# Patient Record
Sex: Female | Born: 1980 | Race: White | Hispanic: No | Smoking: Current every day smoker
Health system: Southern US, Community
[De-identification: ages and names within clinical notes are randomized; demographics above are authoritative.]

## PROBLEM LIST (undated history)

## (undated) DIAGNOSIS — M1990 Unspecified osteoarthritis, unspecified site: Secondary | ICD-10-CM

## (undated) DIAGNOSIS — F32A Depression, unspecified: Secondary | ICD-10-CM

## (undated) DIAGNOSIS — G8929 Other chronic pain: Secondary | ICD-10-CM

## (undated) DIAGNOSIS — J45909 Unspecified asthma, uncomplicated: Secondary | ICD-10-CM

## (undated) DIAGNOSIS — T1491XA Suicide attempt, initial encounter: Secondary | ICD-10-CM

## (undated) DIAGNOSIS — M542 Cervicalgia: Secondary | ICD-10-CM

## (undated) DIAGNOSIS — F410 Panic disorder [episodic paroxysmal anxiety] without agoraphobia: Secondary | ICD-10-CM

## (undated) DIAGNOSIS — D649 Anemia, unspecified: Secondary | ICD-10-CM

## (undated) DIAGNOSIS — K219 Gastro-esophageal reflux disease without esophagitis: Secondary | ICD-10-CM

## (undated) DIAGNOSIS — M25512 Pain in left shoulder: Secondary | ICD-10-CM

## (undated) DIAGNOSIS — R0789 Other chest pain: Secondary | ICD-10-CM

## (undated) DIAGNOSIS — R011 Cardiac murmur, unspecified: Secondary | ICD-10-CM

## (undated) DIAGNOSIS — F419 Anxiety disorder, unspecified: Secondary | ICD-10-CM

## (undated) DIAGNOSIS — F329 Major depressive disorder, single episode, unspecified: Secondary | ICD-10-CM

## (undated) DIAGNOSIS — Z8739 Personal history of other diseases of the musculoskeletal system and connective tissue: Secondary | ICD-10-CM

## (undated) HISTORY — PX: OTHER SURGICAL HISTORY: SHX169

## (undated) HISTORY — PX: APPENDECTOMY: SHX54

## (undated) HISTORY — PX: MYRINGOTOMY WITH TUBE PLACEMENT: SHX5663

---

## 2001-09-03 ENCOUNTER — Emergency Department (HOSPITAL_COMMUNITY): Admission: EM | Admit: 2001-09-03 | Discharge: 2001-09-04 | Payer: Self-pay | Admitting: Emergency Medicine

## 2002-02-28 ENCOUNTER — Encounter: Payer: Self-pay | Admitting: Internal Medicine

## 2002-02-28 ENCOUNTER — Emergency Department (HOSPITAL_COMMUNITY): Admission: EM | Admit: 2002-02-28 | Discharge: 2002-02-28 | Payer: Self-pay | Admitting: Emergency Medicine

## 2003-09-26 ENCOUNTER — Ambulatory Visit (HOSPITAL_COMMUNITY): Admission: RE | Admit: 2003-09-26 | Discharge: 2003-09-26 | Payer: Self-pay | Admitting: *Deleted

## 2004-08-18 ENCOUNTER — Emergency Department (HOSPITAL_COMMUNITY): Admission: EM | Admit: 2004-08-18 | Discharge: 2004-08-18 | Payer: Self-pay | Admitting: Emergency Medicine

## 2005-08-28 ENCOUNTER — Ambulatory Visit: Payer: Self-pay | Admitting: Internal Medicine

## 2005-09-04 ENCOUNTER — Ambulatory Visit: Payer: Self-pay | Admitting: Cardiology

## 2006-10-29 ENCOUNTER — Emergency Department (HOSPITAL_COMMUNITY): Admission: EM | Admit: 2006-10-29 | Discharge: 2006-10-30 | Payer: Self-pay | Admitting: Emergency Medicine

## 2008-09-06 ENCOUNTER — Emergency Department (HOSPITAL_COMMUNITY): Admission: EM | Admit: 2008-09-06 | Discharge: 2008-09-06 | Payer: Self-pay | Admitting: Emergency Medicine

## 2011-01-05 ENCOUNTER — Other Ambulatory Visit (HOSPITAL_COMMUNITY): Payer: Self-pay | Admitting: Sports Medicine

## 2011-01-05 DIAGNOSIS — M75102 Unspecified rotator cuff tear or rupture of left shoulder, not specified as traumatic: Secondary | ICD-10-CM

## 2011-01-12 ENCOUNTER — Other Ambulatory Visit (HOSPITAL_COMMUNITY): Payer: Self-pay

## 2011-01-15 ENCOUNTER — Other Ambulatory Visit: Payer: Self-pay | Admitting: Sports Medicine

## 2011-01-15 DIAGNOSIS — M75100 Unspecified rotator cuff tear or rupture of unspecified shoulder, not specified as traumatic: Secondary | ICD-10-CM

## 2011-01-18 ENCOUNTER — Ambulatory Visit
Admission: RE | Admit: 2011-01-18 | Discharge: 2011-01-18 | Disposition: A | Discharge status: Home / Self Care | Payer: Self-pay | Source: Ambulatory Visit | Attending: Sports Medicine | Admitting: Sports Medicine

## 2011-01-18 DIAGNOSIS — M75100 Unspecified rotator cuff tear or rupture of unspecified shoulder, not specified as traumatic: Secondary | ICD-10-CM

## 2011-01-22 ENCOUNTER — Other Ambulatory Visit: Payer: Self-pay | Admitting: Sports Medicine

## 2011-01-22 DIAGNOSIS — M542 Cervicalgia: Secondary | ICD-10-CM

## 2011-01-25 ENCOUNTER — Other Ambulatory Visit: Payer: Self-pay

## 2011-02-12 ENCOUNTER — Other Ambulatory Visit: Payer: Self-pay | Admitting: Orthopedic Surgery

## 2011-02-12 DIAGNOSIS — M542 Cervicalgia: Secondary | ICD-10-CM

## 2011-02-17 ENCOUNTER — Ambulatory Visit
Admission: RE | Admit: 2011-02-17 | Discharge: 2011-02-17 | Disposition: A | Discharge status: Home / Self Care | Payer: Medicaid Other | Source: Ambulatory Visit | Attending: Orthopedic Surgery | Admitting: Orthopedic Surgery

## 2011-02-17 DIAGNOSIS — M542 Cervicalgia: Secondary | ICD-10-CM

## 2012-07-22 ENCOUNTER — Emergency Department (HOSPITAL_COMMUNITY): Payer: Self-pay

## 2012-07-22 ENCOUNTER — Encounter (HOSPITAL_COMMUNITY): Payer: Self-pay | Admitting: *Deleted

## 2012-07-22 ENCOUNTER — Emergency Department (HOSPITAL_COMMUNITY)
Admission: EM | Admit: 2012-07-22 | Discharge: 2012-07-22 | Disposition: A | Discharge status: Home / Self Care | Payer: Self-pay | Attending: Emergency Medicine | Admitting: Emergency Medicine

## 2012-07-22 ENCOUNTER — Other Ambulatory Visit: Payer: Self-pay

## 2012-07-22 DIAGNOSIS — I2510 Atherosclerotic heart disease of native coronary artery without angina pectoris: Secondary | ICD-10-CM | POA: Insufficient documentation

## 2012-07-22 DIAGNOSIS — R0789 Other chest pain: Secondary | ICD-10-CM | POA: Insufficient documentation

## 2012-07-22 DIAGNOSIS — F411 Generalized anxiety disorder: Secondary | ICD-10-CM | POA: Insufficient documentation

## 2012-07-22 DIAGNOSIS — F419 Anxiety disorder, unspecified: Secondary | ICD-10-CM

## 2012-07-22 DIAGNOSIS — Z9089 Acquired absence of other organs: Secondary | ICD-10-CM | POA: Insufficient documentation

## 2012-07-22 LAB — COMPREHENSIVE METABOLIC PANEL
ALT: 12 U/L (ref 0–35)
AST: 15 U/L (ref 0–37)
Albumin: 3.6 g/dL (ref 3.5–5.2)
Alkaline Phosphatase: 80 U/L (ref 39–117)
BUN: 6 mg/dL (ref 6–23)
CO2: 23 mEq/L (ref 19–32)
Calcium: 8.9 mg/dL (ref 8.4–10.5)
Chloride: 103 mEq/L (ref 96–112)
Creatinine, Ser: 0.58 mg/dL (ref 0.50–1.10)
GFR calc Af Amer: >90 mL/min (ref >90)
GFR calc non Af Amer: >90 mL/min (ref >90)
Glucose, Bld: 109 mg/dL — ABNORMAL HIGH (ref 70–99)
Potassium: 3 mEq/L — ABNORMAL LOW (ref 3.5–5.1)
Sodium: 136 mEq/L (ref 135–145)
Total Bilirubin: 0.4 mg/dL (ref 0.3–1.2)
Total Protein: 6.5 g/dL (ref 6.0–8.3)

## 2012-07-22 LAB — CBC WITH DIFFERENTIAL/PLATELET
Basophils Absolute: 0 10*3/uL (ref 0.0–0.1)
Basophils Relative: 0 % (ref 0–1)
Eosinophils Absolute: 0.1 10*3/uL (ref 0.0–0.7)
Eosinophils Relative: 1 % (ref 0–5)
HCT: 40.3 % (ref 36.0–46.0)
Hemoglobin: 14.6 g/dL (ref 12.0–15.0)
Lymphocytes Relative: 44 % (ref 12–46)
Lymphs Abs: 2.6 10*3/uL (ref 0.7–4.0)
MCH: 33.6 pg (ref 26.0–34.0)
MCHC: 36.2 g/dL — ABNORMAL HIGH (ref 30.0–36.0)
MCV: 92.6 fL (ref 78.0–100.0)
Monocytes Absolute: 0.4 10*3/uL (ref 0.1–1.0)
Monocytes Relative: 7 % (ref 3–12)
Neutro Abs: 2.8 10*3/uL (ref 1.7–7.7)
Neutrophils Relative %: 48 % (ref 43–77)
Platelets: 205 10*3/uL (ref 150–400)
RBC: 4.35 MIL/uL (ref 3.87–5.11)
RDW: 12.5 % (ref 11.5–15.5)
WBC: 5.8 10*3/uL (ref 4.0–10.5)

## 2012-07-22 LAB — TROPONIN I: Troponin I: <0.3 ng/mL (ref <0.30)

## 2012-07-22 MED ORDER — DIAZEPAM 5 MG PO TABS: 5.0000 mg | ORAL_TABLET | Freq: Four times a day (QID) | ORAL | Status: DC | PRN

## 2012-07-22 NOTE — ED Provider Notes (Signed)
History     CSN: [id number]  Arrival date & time 07/22/12  7829   First MD Initiated Contact with Patient 07/22/12 1833      Chief Complaint  Patient presents with  . Chest Pain    (Consider location/radiation/quality/duration/timing/severity/associated sxs/prior treatment) HPI Comments: Patient with complaints of chest tightness since last night.  She tells me that she had a lot of stress in her life due to the passing of a close friend.  She has not seen him in some time and regrets not seeing him more.  She denies any history of heart problems.  There is no shortness of breath, diaphoresis, or radiation into the arm or jaw.  She denies recent exertional symptoms.  She does smoke 1 PPD and has done so for many years.  Patient is a 31 y.o. female presenting with chest pain. The history is provided by the patient.  Chest Pain Episode onset: last night. Duration of episode(s) is 24 hours. Chest pain occurs constantly. The chest pain is unchanged. The pain is associated with stress. The severity of the pain is moderate. The quality of the pain is described as tightness. The pain does not radiate. Chest pain is worsened by stress. Pertinent negatives for primary symptoms include no fever, no cough, no palpitations, no nausea and no dizziness. She tried nothing for the symptoms. Risk factors include smoking/tobacco exposure.     Past Medical History  Diagnosis Date  . Coronary artery disease     Past Surgical History  Procedure Date  . Appendectomy     History reviewed. No pertinent family history.  History  Substance Use Topics  . Smoking status: Current Some Day Smoker  . Smokeless tobacco: Not on file  . Alcohol Use: No    OB History    Grav Para Term Preterm Abortions TAB SAB Ect Mult Living                  Review of Systems  Constitutional: Negative for fever.  Respiratory: Negative for cough.   Cardiovascular: Positive for chest pain. Negative for palpitations.    Gastrointestinal: Negative for nausea.  Neurological: Negative for dizziness.  All other systems reviewed and are negative.    Allergies  Review of patient's allergies indicates no known allergies.  Home Medications  No current outpatient prescriptions on file.  BP 107/60  Pulse 87  Temp 98.8 F (37.1 C) (Oral)  Resp 20  SpO2 97%  LMP 07/18/2012  Physical Exam  Nursing note and vitals reviewed. Constitutional: She is oriented to person, place, and time. She appears well-developed and well-nourished. No distress.  HENT:  Head: Normocephalic and atraumatic.  Neck: Normal range of motion. Neck supple.  Cardiovascular: Normal rate and regular rhythm.  Exam reveals no gallop and no friction rub.   No murmur heard. Pulmonary/Chest: Effort normal and breath sounds normal. No respiratory distress. She has no wheezes.  Abdominal: Soft. Bowel sounds are normal. She exhibits no distension. There is no tenderness.  Musculoskeletal: Normal range of motion.  Neurological: She is alert and oriented to person, place, and time.  Skin: Skin is warm and dry. She is not diaphoretic.    ED Course  Procedures (including critical care time)   Labs Reviewed  CBC WITH DIFFERENTIAL  COMPREHENSIVE METABOLIC PANEL  TROPONIN I   No results found.   No diagnosis found.   Date: 07/22/2012  Rate: 84  Rhythm: normal sinus rhythm  QRS Axis: normal  Intervals: normal  ST/T Wave abnormalities: normal  Conduction Disutrbances:none  Narrative Interpretation:   Old EKG Reviewed: unchanged     MDM  The patient presents here with atypical chest pain that seems to be related to stress/anxiety due to the recent loss of a loved one.  The ekg and trop are both reassuring and I seriously doubt there is a cardiac etiology to this.  She seems fine and I believe she is very stable for discharge.  I will prescribe a small number of valium for her as these have helped her in the past.  She understands  to return if she experiences additional symptoms or if her condition worsens.          Geoffery Lyons, MD 07/22/12 2006

## 2012-07-22 NOTE — ED Notes (Signed)
Discharge instructions reviewed with pt, questions answered. Pt verbalized understanding.  

## 2012-07-22 NOTE — ED Notes (Signed)
Chest pain since yesterday, recent death in family with family problems

## 2012-07-24 ENCOUNTER — Emergency Department (HOSPITAL_COMMUNITY)
Admission: EM | Admit: 2012-07-24 | Discharge: 2012-07-24 | Disposition: A | Discharge status: Home / Self Care | Payer: Self-pay | Attending: Emergency Medicine | Admitting: Emergency Medicine

## 2012-07-24 ENCOUNTER — Encounter (HOSPITAL_COMMUNITY): Payer: Self-pay

## 2012-07-24 DIAGNOSIS — F17200 Nicotine dependence, unspecified, uncomplicated: Secondary | ICD-10-CM | POA: Insufficient documentation

## 2012-07-24 DIAGNOSIS — F329 Major depressive disorder, single episode, unspecified: Secondary | ICD-10-CM | POA: Insufficient documentation

## 2012-07-24 DIAGNOSIS — F3289 Other specified depressive episodes: Secondary | ICD-10-CM | POA: Insufficient documentation

## 2012-07-24 DIAGNOSIS — I2510 Atherosclerotic heart disease of native coronary artery without angina pectoris: Secondary | ICD-10-CM | POA: Insufficient documentation

## 2012-07-24 DIAGNOSIS — F411 Generalized anxiety disorder: Secondary | ICD-10-CM | POA: Insufficient documentation

## 2012-07-24 HISTORY — DX: Anxiety disorder, unspecified: F41.9

## 2012-07-24 HISTORY — DX: Depression, unspecified: F32.A

## 2012-07-24 HISTORY — DX: Panic disorder (episodic paroxysmal anxiety): F41.0

## 2012-07-24 HISTORY — DX: Suicide attempt, initial encounter: T14.91XA

## 2012-07-24 HISTORY — DX: Major depressive disorder, single episode, unspecified: F32.9

## 2012-07-24 LAB — CBC WITH DIFFERENTIAL/PLATELET
Basophils Absolute: 0 10*3/uL (ref 0.0–0.1)
Basophils Relative: 0 % (ref 0–1)
Eosinophils Absolute: 0 10*3/uL (ref 0.0–0.7)
Eosinophils Relative: 0 % (ref 0–5)
HCT: 44 % (ref 36.0–46.0)
Hemoglobin: 15.9 g/dL — ABNORMAL HIGH (ref 12.0–15.0)
Lymphocytes Relative: 22 % (ref 12–46)
Lymphs Abs: 2 10*3/uL (ref 0.7–4.0)
MCH: 33.4 pg (ref 26.0–34.0)
MCHC: 36.1 g/dL — ABNORMAL HIGH (ref 30.0–36.0)
MCV: 92.4 fL (ref 78.0–100.0)
Monocytes Absolute: 0.6 10*3/uL (ref 0.1–1.0)
Monocytes Relative: 6 % (ref 3–12)
Neutro Abs: 6.6 10*3/uL (ref 1.7–7.7)
Neutrophils Relative %: 72 % (ref 43–77)
Platelets: 224 10*3/uL (ref 150–400)
RBC: 4.76 MIL/uL (ref 3.87–5.11)
RDW: 12.4 % (ref 11.5–15.5)
WBC: 9.2 10*3/uL (ref 4.0–10.5)

## 2012-07-24 LAB — BASIC METABOLIC PANEL
BUN: 6 mg/dL (ref 6–23)
CO2: 25 mEq/L (ref 19–32)
Calcium: 9.3 mg/dL (ref 8.4–10.5)
Chloride: 101 mEq/L (ref 96–112)
Creatinine, Ser: 0.69 mg/dL (ref 0.50–1.10)
GFR calc Af Amer: >90 mL/min (ref >90)
GFR calc non Af Amer: >90 mL/min (ref >90)
Glucose, Bld: 85 mg/dL (ref 70–99)
Potassium: 3.6 mEq/L (ref 3.5–5.1)
Sodium: 136 mEq/L (ref 135–145)

## 2012-07-24 LAB — RAPID URINE DRUG SCREEN, HOSP PERFORMED
Amphetamines: NOT DETECTED
Barbiturates: NOT DETECTED
Benzodiazepines: POSITIVE — AB
Cocaine: NOT DETECTED
Opiates: NOT DETECTED
Tetrahydrocannabinol: POSITIVE — AB

## 2012-07-24 LAB — ETHANOL: Alcohol, Ethyl (B): <11 mg/dL (ref 0–11)

## 2012-07-24 MED ORDER — ALPRAZOLAM 0.5 MG PO TABS: 0.5000 mg | ORAL_TABLET | Freq: Three times a day (TID) | ORAL | Status: DC | PRN

## 2012-07-24 NOTE — ED Notes (Signed)
Pt wanded by security while in triage
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
## 2012-07-24 NOTE — ED Notes (Signed)
Pt reports depression, was told by police to come to er for eval, stated she is not si/hi.
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
## 2012-07-24 NOTE — ED Notes (Signed)
ACT team at bedside.  

## 2012-08-05 ENCOUNTER — Encounter (HOSPITAL_COMMUNITY): Payer: Self-pay | Admitting: *Deleted

## 2012-08-05 ENCOUNTER — Emergency Department (HOSPITAL_COMMUNITY)
Admission: EM | Admit: 2012-08-05 | Discharge: 2012-08-05 | Disposition: A | Discharge status: Home / Self Care | Payer: Self-pay | Attending: Emergency Medicine | Admitting: Emergency Medicine

## 2012-08-05 ENCOUNTER — Emergency Department (HOSPITAL_COMMUNITY): Payer: Self-pay

## 2012-08-05 DIAGNOSIS — F411 Generalized anxiety disorder: Secondary | ICD-10-CM | POA: Insufficient documentation

## 2012-08-05 DIAGNOSIS — R0609 Other forms of dyspnea: Secondary | ICD-10-CM | POA: Insufficient documentation

## 2012-08-05 DIAGNOSIS — F3289 Other specified depressive episodes: Secondary | ICD-10-CM | POA: Insufficient documentation

## 2012-08-05 DIAGNOSIS — R059 Cough, unspecified: Secondary | ICD-10-CM | POA: Insufficient documentation

## 2012-08-05 DIAGNOSIS — R0602 Shortness of breath: Secondary | ICD-10-CM | POA: Insufficient documentation

## 2012-08-05 DIAGNOSIS — R0989 Other specified symptoms and signs involving the circulatory and respiratory systems: Secondary | ICD-10-CM | POA: Insufficient documentation

## 2012-08-05 DIAGNOSIS — R05 Cough: Secondary | ICD-10-CM

## 2012-08-05 DIAGNOSIS — F329 Major depressive disorder, single episode, unspecified: Secondary | ICD-10-CM

## 2012-08-05 HISTORY — DX: Cardiac murmur, unspecified: R01.1

## 2012-08-05 MED ORDER — DIAZEPAM 5 MG PO TABS: ORAL_TABLET | ORAL | Status: DC

## 2012-08-05 MED ORDER — PREDNISONE 20 MG PO TABS
60.0000 mg | ORAL_TABLET | Freq: Once | ORAL | Status: AC
  Administered 2012-08-05: 60 mg via ORAL
  Filled 2012-08-05: qty 3

## 2012-08-05 MED ORDER — ALBUTEROL SULFATE HFA 108 (90 BASE) MCG/ACT IN AERS
2.0000 | INHALATION_SPRAY | RESPIRATORY_TRACT | Status: DC
  Administered 2012-08-05: 2 via RESPIRATORY_TRACT
  Filled 2012-08-05: qty 6.7

## 2012-08-05 MED ORDER — PREDNISONE 10 MG PO TABS: ORAL_TABLET | ORAL | Status: DC

## 2012-08-05 NOTE — ED Notes (Signed)
Patient transported to X-ray 

## 2012-08-05 NOTE — ED Notes (Addendum)
Cough , white sputum, feels sob.  Back pain

## 2012-08-05 NOTE — ED Notes (Signed)
H. Bryant, PA at bedside. 

## 2012-08-05 NOTE — ED Notes (Signed)
Pt with cough, denies fever, lungs clear after coughing, also with lower back pain, denies radiation of pain to legs
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
## 2012-08-21 NOTE — ED Provider Notes (Signed)
Medical screening examination/treatment/procedure(s) were performed by non-physician practitioner and as supervising physician I was immediately available for consultation/collaboration.  Flint Melter, MD 08/21/12 [phone]

## 2012-09-22 ENCOUNTER — Encounter (HOSPITAL_COMMUNITY): Payer: Self-pay | Admitting: *Deleted

## 2012-09-22 ENCOUNTER — Emergency Department (HOSPITAL_COMMUNITY)
Admission: EM | Admit: 2012-09-22 | Discharge: 2012-09-22 | Disposition: A | Discharge status: Home / Self Care | Payer: Self-pay | Attending: Emergency Medicine | Admitting: Emergency Medicine

## 2012-09-22 DIAGNOSIS — F411 Generalized anxiety disorder: Secondary | ICD-10-CM | POA: Insufficient documentation

## 2012-09-22 DIAGNOSIS — R071 Chest pain on breathing: Secondary | ICD-10-CM | POA: Insufficient documentation

## 2012-09-22 DIAGNOSIS — R0602 Shortness of breath: Secondary | ICD-10-CM | POA: Insufficient documentation

## 2012-09-22 DIAGNOSIS — Z8679 Personal history of other diseases of the circulatory system: Secondary | ICD-10-CM | POA: Insufficient documentation

## 2012-09-22 DIAGNOSIS — R0789 Other chest pain: Secondary | ICD-10-CM

## 2012-09-22 DIAGNOSIS — Z79899 Other long term (current) drug therapy: Secondary | ICD-10-CM | POA: Insufficient documentation

## 2012-09-22 DIAGNOSIS — I2510 Atherosclerotic heart disease of native coronary artery without angina pectoris: Secondary | ICD-10-CM | POA: Insufficient documentation

## 2012-09-22 DIAGNOSIS — F329 Major depressive disorder, single episode, unspecified: Secondary | ICD-10-CM | POA: Insufficient documentation

## 2012-09-22 DIAGNOSIS — F17200 Nicotine dependence, unspecified, uncomplicated: Secondary | ICD-10-CM | POA: Insufficient documentation

## 2012-09-22 DIAGNOSIS — F3289 Other specified depressive episodes: Secondary | ICD-10-CM | POA: Insufficient documentation

## 2012-09-22 LAB — TROPONIN I: Troponin I: <0.3 ng/mL (ref <0.30)

## 2012-09-22 MED ORDER — CYCLOBENZAPRINE HCL 5 MG PO TABS: 5.0000 mg | ORAL_TABLET | Freq: Three times a day (TID) | ORAL | Status: DC | PRN

## 2012-09-22 MED ORDER — IBUPROFEN 600 MG PO TABS: 600.0000 mg | ORAL_TABLET | Freq: Three times a day (TID) | ORAL | Status: DC | PRN

## 2012-09-22 MED ORDER — IBUPROFEN 800 MG PO TABS
800.0000 mg | ORAL_TABLET | Freq: Once | ORAL | Status: AC
  Administered 2012-09-22: 800 mg via ORAL
  Filled 2012-09-22: qty 1

## 2012-09-22 MED ORDER — CYCLOBENZAPRINE HCL 10 MG PO TABS
10.0000 mg | ORAL_TABLET | Freq: Once | ORAL | Status: AC
  Administered 2012-09-22: 10 mg via ORAL
  Filled 2012-09-22: qty 1

## 2012-09-22 NOTE — ED Notes (Signed)
Patient states that she did take 3 nitroglycerin of her mothers at home without relief.  States that she has taken aspirin as well.

## 2012-09-22 NOTE — ED Notes (Signed)
Reports onset of chest pain today at 1500; states has been anxious and upset re: having to go to court today to deal with "some issues" with her daughter.  States hx of panic attacks.  Took ASA 325mg  po and NTG 0.4mg  SL x 3 with no relief.

## 2012-09-22 NOTE — ED Notes (Addendum)
Patient counseled regarding the use of medications that are not prescribed to her relating to her mother's nitroglycerin, mother was also at bedside for this discussion, the dangers of this type of medication were thoroughly discussed with patient and family at bedside.  The patient states that she will not take any other medications that are not prescribed to her.  Mother also states that she will not give nitroglycerin to anyone else again.
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
## 2012-11-14 ENCOUNTER — Emergency Department (HOSPITAL_COMMUNITY)
Admission: EM | Admit: 2012-11-14 | Discharge: 2012-11-14 | Disposition: A | Discharge status: Home / Self Care | Payer: Self-pay | Attending: Emergency Medicine | Admitting: Emergency Medicine

## 2012-11-14 ENCOUNTER — Other Ambulatory Visit: Payer: Self-pay

## 2012-11-14 ENCOUNTER — Emergency Department (HOSPITAL_COMMUNITY): Payer: Self-pay

## 2012-11-14 ENCOUNTER — Encounter (HOSPITAL_COMMUNITY): Payer: Self-pay

## 2012-11-14 DIAGNOSIS — F329 Major depressive disorder, single episode, unspecified: Secondary | ICD-10-CM | POA: Insufficient documentation

## 2012-11-14 DIAGNOSIS — Z3202 Encounter for pregnancy test, result negative: Secondary | ICD-10-CM | POA: Insufficient documentation

## 2012-11-14 DIAGNOSIS — F17200 Nicotine dependence, unspecified, uncomplicated: Secondary | ICD-10-CM | POA: Insufficient documentation

## 2012-11-14 DIAGNOSIS — R011 Cardiac murmur, unspecified: Secondary | ICD-10-CM | POA: Insufficient documentation

## 2012-11-14 DIAGNOSIS — Z79899 Other long term (current) drug therapy: Secondary | ICD-10-CM | POA: Insufficient documentation

## 2012-11-14 DIAGNOSIS — R109 Unspecified abdominal pain: Secondary | ICD-10-CM | POA: Insufficient documentation

## 2012-11-14 DIAGNOSIS — F3289 Other specified depressive episodes: Secondary | ICD-10-CM | POA: Insufficient documentation

## 2012-11-14 DIAGNOSIS — I2510 Atherosclerotic heart disease of native coronary artery without angina pectoris: Secondary | ICD-10-CM | POA: Insufficient documentation

## 2012-11-14 DIAGNOSIS — N390 Urinary tract infection, site not specified: Secondary | ICD-10-CM | POA: Insufficient documentation

## 2012-11-14 DIAGNOSIS — M549 Dorsalgia, unspecified: Secondary | ICD-10-CM | POA: Insufficient documentation

## 2012-11-14 DIAGNOSIS — Z8659 Personal history of other mental and behavioral disorders: Secondary | ICD-10-CM | POA: Insufficient documentation

## 2012-11-14 LAB — CBC WITH DIFFERENTIAL/PLATELET
Eosinophils Relative: 2 % (ref 0–5)
HCT: 35.9 % — ABNORMAL LOW (ref 36.0–46.0)
Hemoglobin: 12.8 g/dL (ref 12.0–15.0)
Lymphocytes Relative: 51 % — ABNORMAL HIGH (ref 12–46)
MCV: 93.2 fL (ref 78.0–100.0)
Monocytes Absolute: 0.4 10*3/uL (ref 0.1–1.0)
Monocytes Relative: 8 % (ref 3–12)
Neutro Abs: 2.2 10*3/uL (ref 1.7–7.7)
WBC: 5.5 10*3/uL (ref 4.0–10.5)

## 2012-11-14 LAB — URINALYSIS, ROUTINE W REFLEX MICROSCOPIC
Hgb urine dipstick: NEGATIVE
Nitrite: NEGATIVE
Protein, ur: NEGATIVE mg/dL
Urobilinogen, UA: 0.2 mg/dL (ref 0.0–1.0)

## 2012-11-14 LAB — URINE MICROSCOPIC-ADD ON

## 2012-11-14 LAB — COMPREHENSIVE METABOLIC PANEL
BUN: 10 mg/dL (ref 6–23)
CO2: 27 mEq/L (ref 19–32)
Chloride: 102 mEq/L (ref 96–112)
Creatinine, Ser: 0.77 mg/dL (ref 0.50–1.10)
GFR calc Af Amer: >90 mL/min (ref >90)
GFR calc non Af Amer: >90 mL/min (ref >90)
Glucose, Bld: 93 mg/dL (ref 70–99)
Total Bilirubin: 0.2 mg/dL — ABNORMAL LOW (ref 0.3–1.2)

## 2012-11-14 LAB — D-DIMER, QUANTITATIVE: D-Dimer, Quant: <0.27 ug/mL-FEU (ref 0.00–0.48)

## 2012-11-14 MED ORDER — HYDROCODONE-ACETAMINOPHEN 5-325 MG PO TABS
1.0000 | ORAL_TABLET | Freq: Once | ORAL | Status: AC
  Administered 2012-11-14: 1 via ORAL
  Filled 2012-11-14: qty 1

## 2012-11-14 MED ORDER — HYDROCODONE-ACETAMINOPHEN 5-325 MG PO TABS: ORAL_TABLET | ORAL | Status: DC

## 2012-11-14 MED ORDER — CEPHALEXIN 500 MG PO CAPS
500.0000 mg | ORAL_CAPSULE | Freq: Once | ORAL | Status: AC
  Administered 2012-11-14: 500 mg via ORAL
  Filled 2012-11-14: qty 1

## 2012-11-14 MED ORDER — CEPHALEXIN 500 MG PO CAPS: 500.0000 mg | ORAL_CAPSULE | Freq: Four times a day (QID) | ORAL | Status: DC

## 2012-11-14 NOTE — ED Notes (Signed)
Pt with right sided rib pain, denies blood in urine or burning on urination, admits to picking up 20 quart tea at work recently, denies cough
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
## 2012-11-14 NOTE — ED Notes (Signed)
States pain started under posterior right ribs, worse with movement. Now pain increased and hurts to take deep breath. No injury noted.no cough or uri symptoms

## 2012-11-14 NOTE — ED Notes (Signed)
Patient transported to X-ray 

## 2012-11-16 LAB — URINE CULTURE: Colony Count: 4000

## 2012-11-18 NOTE — ED Provider Notes (Signed)
Medical screening examination/treatment/procedure(s) were performed by non-physician practitioner and as supervising physician I was immediately available for consultation/collaboration.   Enolia Koepke L Faelyn Sigler, MD 11/18/12 1534 

## 2013-08-20 IMAGING — CR DG CHEST 1V PORT
1 series · 1 of 1 positions shown · non-contrast
Comparison: Report 07/18/2007 no images available

CLINICAL DATA: Chest pain

PORTABLE CHEST - 1 VIEW

[view not recorded]
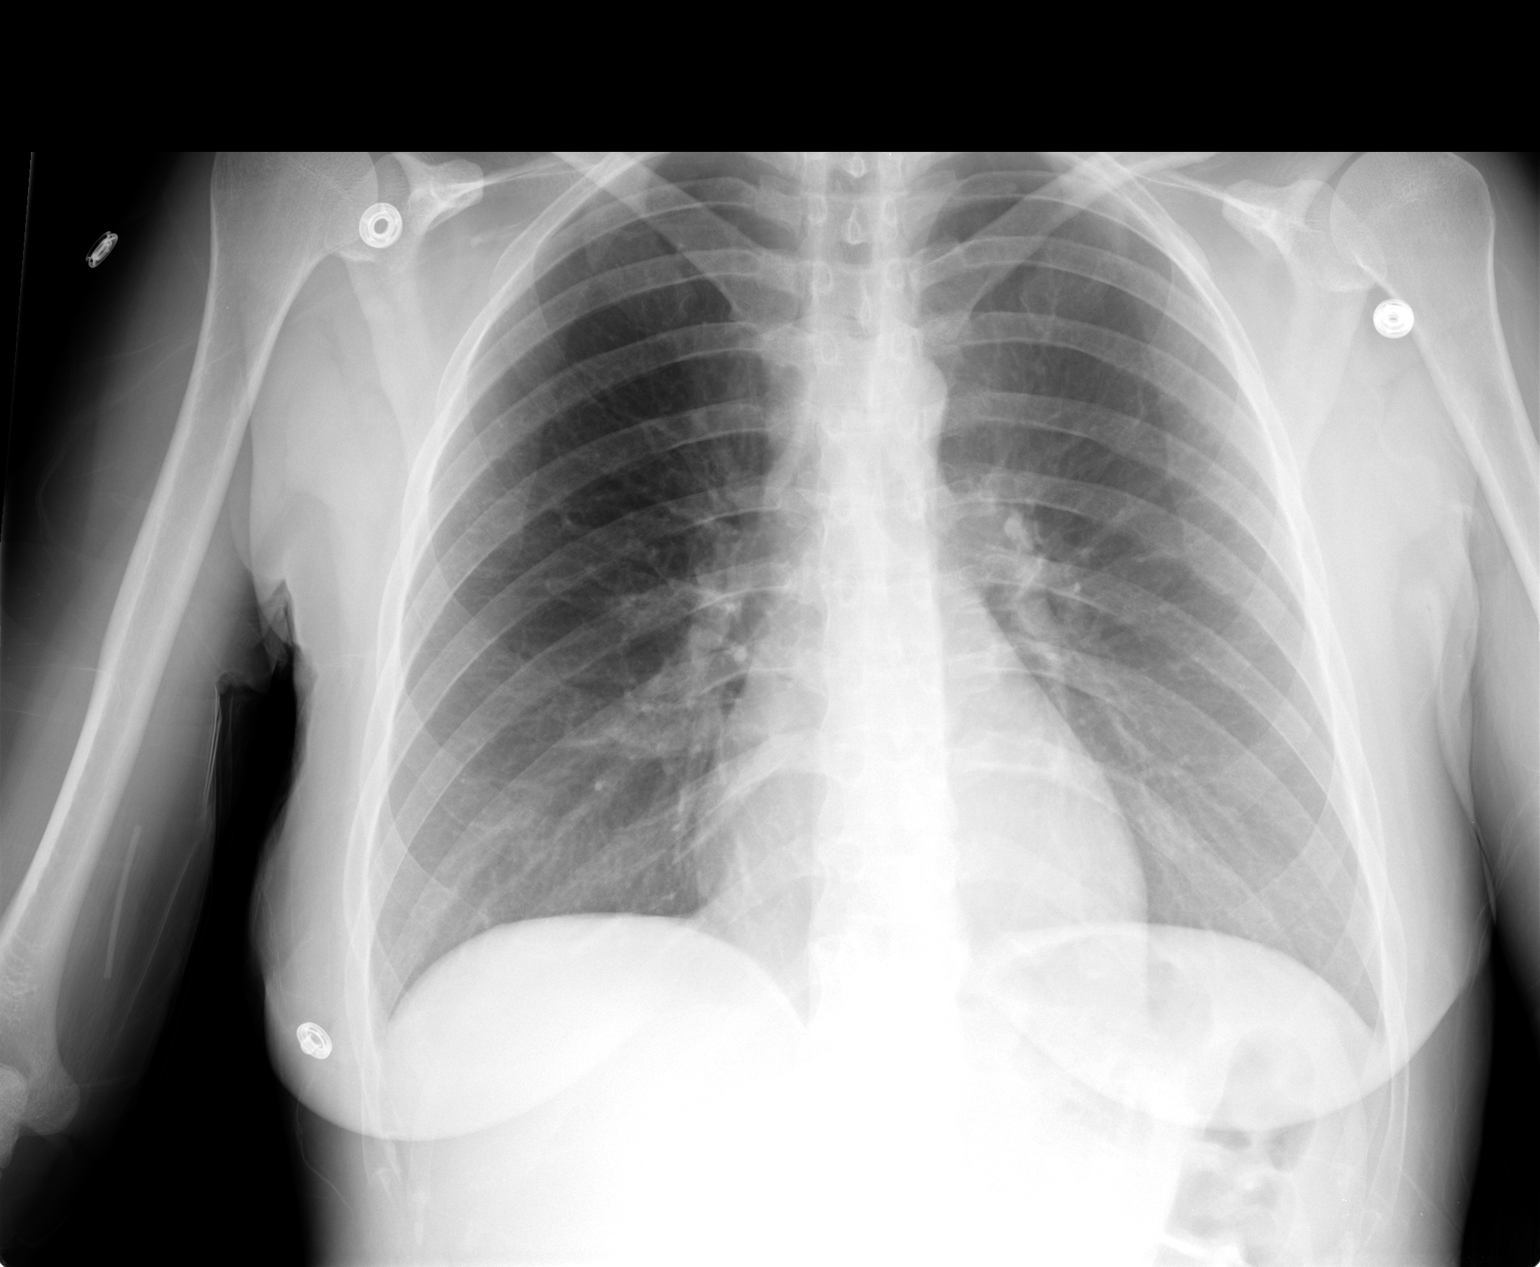

[1 of 1 positions shown; findings below may reference images not displayed]

FINDINGS: The cardiomediastinal silhouette is unremarkable.  No
acute infiltrate or pleural effusion.  No pulmonary edema.  Bony
thorax is unremarkable.
IMPRESSION: No active disease.

## 2013-10-09 ENCOUNTER — Encounter (HOSPITAL_COMMUNITY): Payer: Self-pay | Admitting: Emergency Medicine

## 2013-10-09 ENCOUNTER — Emergency Department (HOSPITAL_COMMUNITY)
Admission: EM | Admit: 2013-10-09 | Discharge: 2013-10-09 | Disposition: A | Discharge status: Home / Self Care | Payer: Medicaid Other | Attending: Emergency Medicine | Admitting: Emergency Medicine

## 2013-10-09 DIAGNOSIS — Y929 Unspecified place or not applicable: Secondary | ICD-10-CM | POA: Insufficient documentation

## 2013-10-09 DIAGNOSIS — F3289 Other specified depressive episodes: Secondary | ICD-10-CM | POA: Insufficient documentation

## 2013-10-09 DIAGNOSIS — W57XXXA Bitten or stung by nonvenomous insect and other nonvenomous arthropods, initial encounter: Secondary | ICD-10-CM

## 2013-10-09 DIAGNOSIS — Z79899 Other long term (current) drug therapy: Secondary | ICD-10-CM | POA: Insufficient documentation

## 2013-10-09 DIAGNOSIS — R011 Cardiac murmur, unspecified: Secondary | ICD-10-CM | POA: Insufficient documentation

## 2013-10-09 DIAGNOSIS — Y939 Activity, unspecified: Secondary | ICD-10-CM | POA: Insufficient documentation

## 2013-10-09 DIAGNOSIS — F410 Panic disorder [episodic paroxysmal anxiety] without agoraphobia: Secondary | ICD-10-CM | POA: Insufficient documentation

## 2013-10-09 DIAGNOSIS — F17200 Nicotine dependence, unspecified, uncomplicated: Secondary | ICD-10-CM | POA: Insufficient documentation

## 2013-10-09 DIAGNOSIS — Z792 Long term (current) use of antibiotics: Secondary | ICD-10-CM | POA: Insufficient documentation

## 2013-10-09 DIAGNOSIS — F329 Major depressive disorder, single episode, unspecified: Secondary | ICD-10-CM | POA: Insufficient documentation

## 2013-10-09 DIAGNOSIS — I2510 Atherosclerotic heart disease of native coronary artery without angina pectoris: Secondary | ICD-10-CM | POA: Insufficient documentation

## 2013-10-09 DIAGNOSIS — L089 Local infection of the skin and subcutaneous tissue, unspecified: Secondary | ICD-10-CM | POA: Insufficient documentation

## 2013-10-09 MED ORDER — DIPHENHYDRAMINE HCL 25 MG PO TABS: 25.0000 mg | ORAL_TABLET | Freq: Four times a day (QID) | ORAL | Status: DC

## 2013-10-09 MED ORDER — SULFAMETHOXAZOLE-TRIMETHOPRIM 800-160 MG PO TABS: 1.0000 | ORAL_TABLET | Freq: Two times a day (BID) | ORAL | Status: AC
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
## 2013-10-09 NOTE — ED Notes (Signed)
Patient c/o possible bug bites x2 to right side of neck.  Denies any drainage. Per patient painful.

## 2013-10-11 NOTE — ED Provider Notes (Signed)
Medical screening examination/treatment/procedure(s) were performed by non-physician practitioner and as supervising physician I was immediately available for consultation/collaboration.  EKG Interpretation   None         Geniyah Eischeid M Erion Weightman, DO 10/11/13 1736 

## 2013-12-11 ENCOUNTER — Emergency Department (HOSPITAL_COMMUNITY)
Admission: EM | Admit: 2013-12-11 | Discharge: 2013-12-11 | Disposition: A | Discharge status: Home / Self Care | Payer: Medicaid Other | Attending: Emergency Medicine | Admitting: Emergency Medicine

## 2013-12-11 ENCOUNTER — Encounter (HOSPITAL_COMMUNITY): Payer: Self-pay | Admitting: Emergency Medicine

## 2013-12-11 ENCOUNTER — Emergency Department (HOSPITAL_COMMUNITY): Payer: Medicaid Other

## 2013-12-11 DIAGNOSIS — Z8659 Personal history of other mental and behavioral disorders: Secondary | ICD-10-CM | POA: Insufficient documentation

## 2013-12-11 DIAGNOSIS — R011 Cardiac murmur, unspecified: Secondary | ICD-10-CM | POA: Insufficient documentation

## 2013-12-11 DIAGNOSIS — S199XXA Unspecified injury of neck, initial encounter: Secondary | ICD-10-CM

## 2013-12-11 DIAGNOSIS — S29019A Strain of muscle and tendon of unspecified wall of thorax, initial encounter: Secondary | ICD-10-CM

## 2013-12-11 DIAGNOSIS — Y9389 Activity, other specified: Secondary | ICD-10-CM | POA: Insufficient documentation

## 2013-12-11 DIAGNOSIS — I2510 Atherosclerotic heart disease of native coronary artery without angina pectoris: Secondary | ICD-10-CM | POA: Insufficient documentation

## 2013-12-11 DIAGNOSIS — S0993XA Unspecified injury of face, initial encounter: Secondary | ICD-10-CM | POA: Insufficient documentation

## 2013-12-11 DIAGNOSIS — F17200 Nicotine dependence, unspecified, uncomplicated: Secondary | ICD-10-CM | POA: Insufficient documentation

## 2013-12-11 DIAGNOSIS — Y929 Unspecified place or not applicable: Secondary | ICD-10-CM | POA: Insufficient documentation

## 2013-12-11 DIAGNOSIS — S239XXA Sprain of unspecified parts of thorax, initial encounter: Secondary | ICD-10-CM | POA: Insufficient documentation

## 2013-12-11 MED ORDER — IBUPROFEN 600 MG PO TABS: 600.0000 mg | ORAL_TABLET | Freq: Four times a day (QID) | ORAL | Status: DC | PRN

## 2013-12-11 MED ORDER — CYCLOBENZAPRINE HCL 5 MG PO TABS: 5.0000 mg | ORAL_TABLET | Freq: Three times a day (TID) | ORAL | Status: DC | PRN
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
## 2013-12-13 IMAGING — CR DG CHEST 2V
2 series · 2 of 2 positions shown · non-contrast
Comparison: 08/05/2012

CLINICAL DATA: Chest pain.

CHEST - 2 VIEW

[view not recorded (1 of 2)]
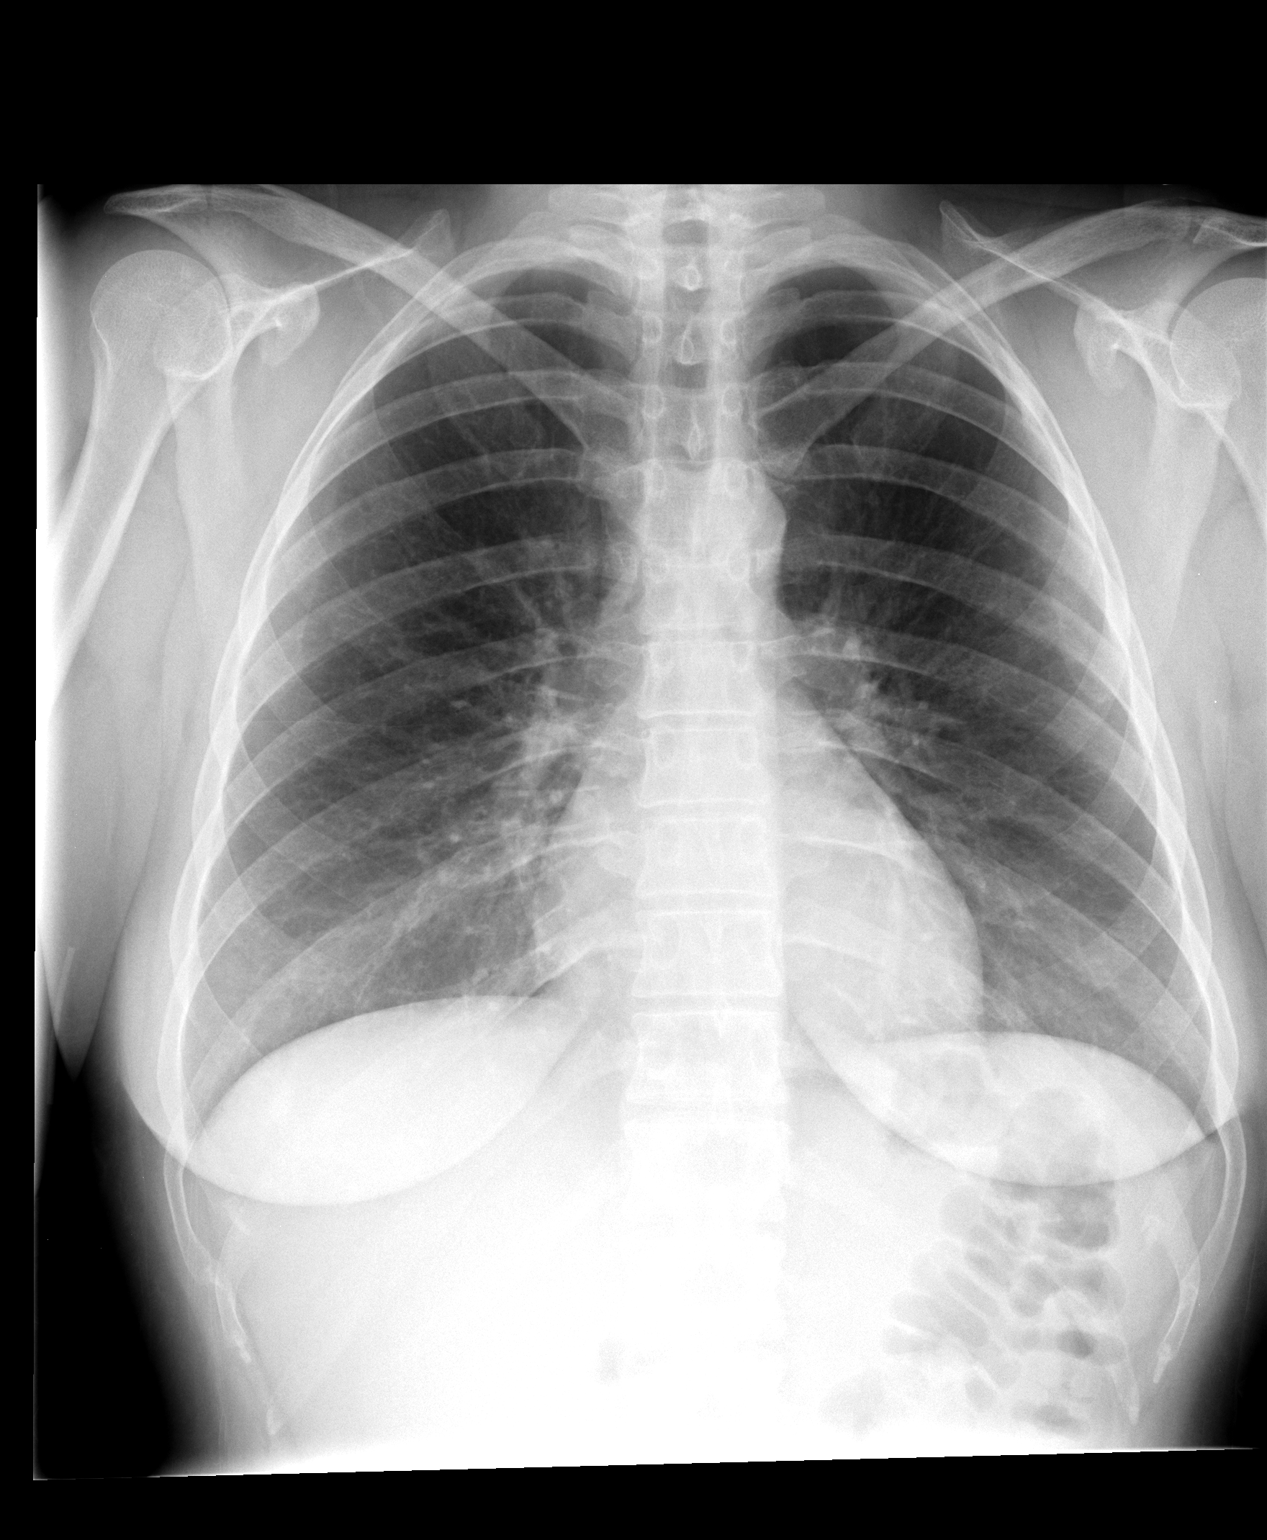

[view not recorded (2 of 2)]
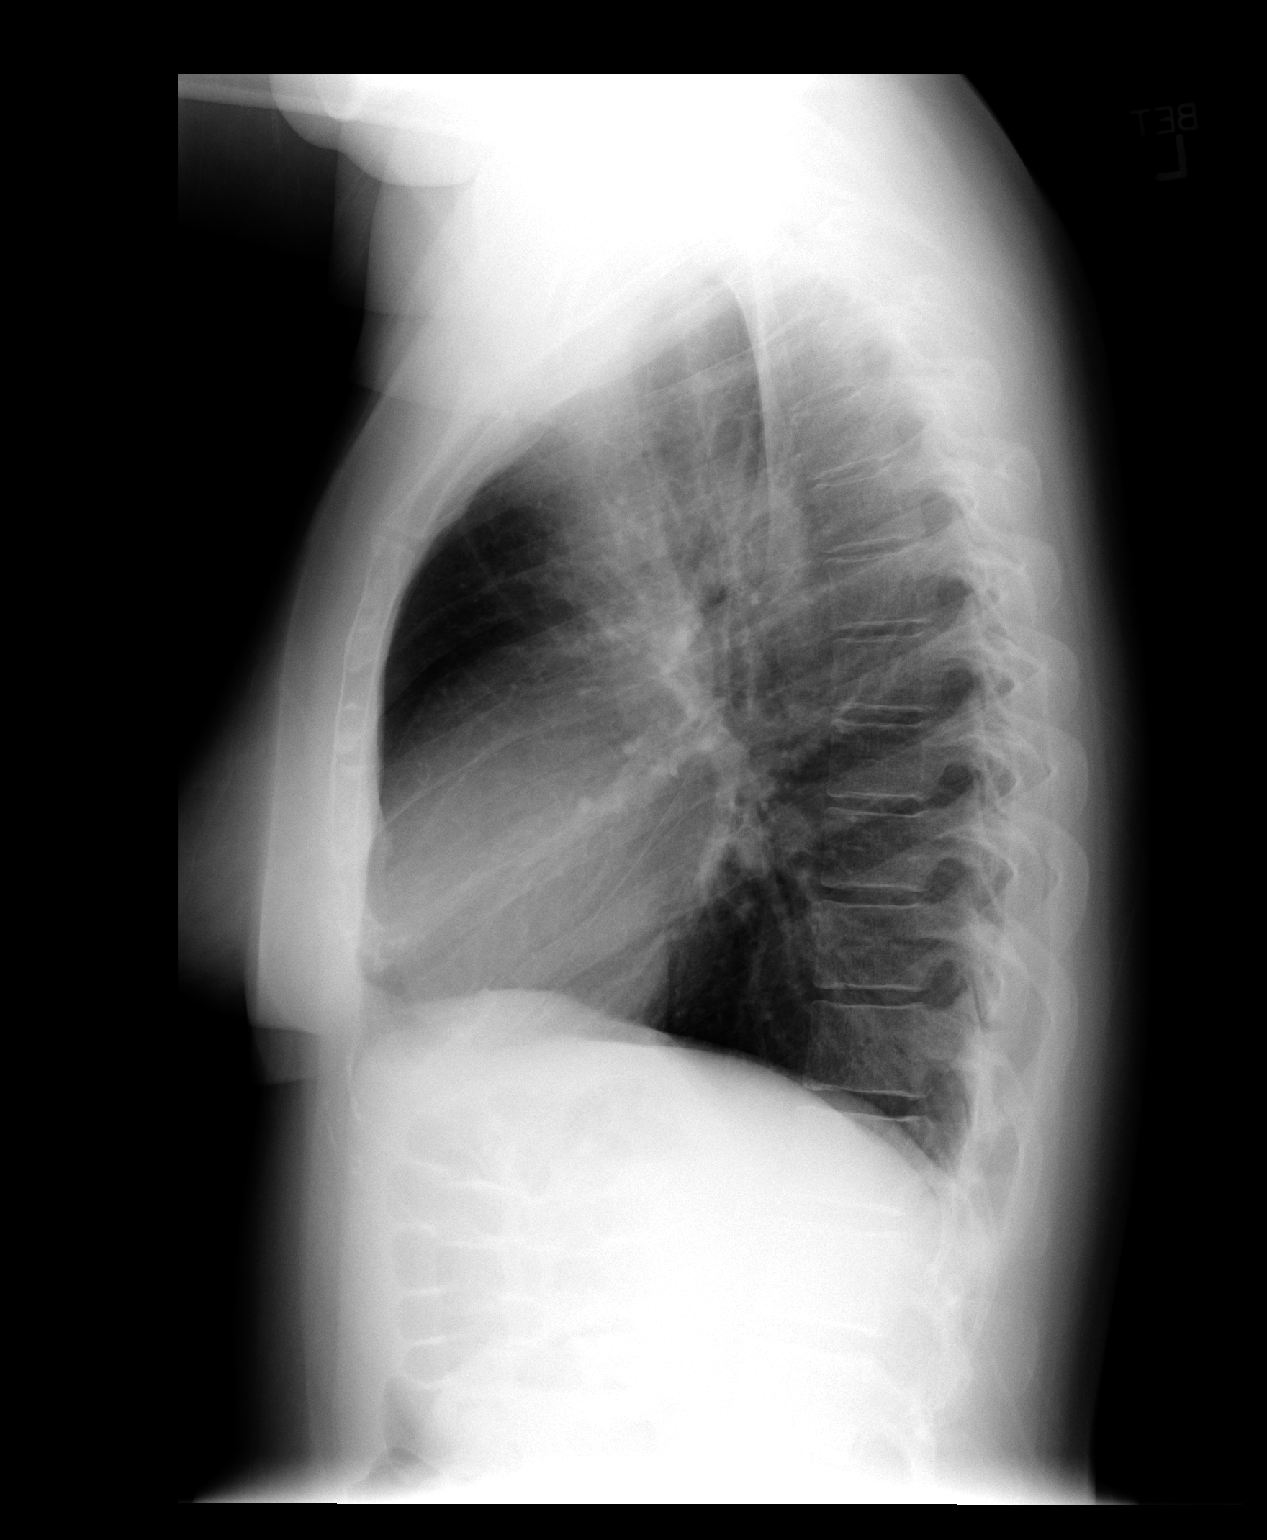

[2 of 2 positions shown; findings below may reference images not displayed]

FINDINGS: No pneumothorax. Lungs clear.  Heart size and pulmonary
vascularity normal.  No effusion.  Visualized bones unremarkable.
IMPRESSION: No acute disease

## 2013-12-14 NOTE — ED Provider Notes (Signed)
Medical screening examination/treatment/procedure(s) were performed by non-physician practitioner and as supervising physician I was immediately available for consultation/collaboration.  EKG Interpretation   None         Halston Kintz L Doyel Mulkern, MD 12/14/13 1434 

## 2014-02-09 ENCOUNTER — Emergency Department (HOSPITAL_COMMUNITY): Payer: Medicaid Other

## 2014-02-09 ENCOUNTER — Encounter (HOSPITAL_COMMUNITY): Payer: Self-pay | Admitting: Emergency Medicine

## 2014-02-09 ENCOUNTER — Emergency Department (HOSPITAL_COMMUNITY)
Admission: EM | Admit: 2014-02-09 | Discharge: 2014-02-09 | Disposition: A | Discharge status: Home / Self Care | Payer: Medicaid Other | Attending: Emergency Medicine | Admitting: Emergency Medicine

## 2014-02-09 DIAGNOSIS — Y929 Unspecified place or not applicable: Secondary | ICD-10-CM | POA: Insufficient documentation

## 2014-02-09 DIAGNOSIS — F17200 Nicotine dependence, unspecified, uncomplicated: Secondary | ICD-10-CM | POA: Insufficient documentation

## 2014-02-09 DIAGNOSIS — F411 Generalized anxiety disorder: Secondary | ICD-10-CM | POA: Insufficient documentation

## 2014-02-09 DIAGNOSIS — R011 Cardiac murmur, unspecified: Secondary | ICD-10-CM | POA: Insufficient documentation

## 2014-02-09 DIAGNOSIS — Y939 Activity, unspecified: Secondary | ICD-10-CM | POA: Insufficient documentation

## 2014-02-09 DIAGNOSIS — I2510 Atherosclerotic heart disease of native coronary artery without angina pectoris: Secondary | ICD-10-CM | POA: Insufficient documentation

## 2014-02-09 DIAGNOSIS — IMO0002 Reserved for concepts with insufficient information to code with codable children: Secondary | ICD-10-CM | POA: Insufficient documentation

## 2014-02-09 DIAGNOSIS — S92501A Displaced unspecified fracture of right lesser toe(s), initial encounter for closed fracture: Secondary | ICD-10-CM

## 2014-02-09 DIAGNOSIS — S92919A Unspecified fracture of unspecified toe(s), initial encounter for closed fracture: Secondary | ICD-10-CM | POA: Insufficient documentation

## 2014-02-09 MED ORDER — HYDROCODONE-ACETAMINOPHEN 5-325 MG PO TABS: 1.0000 | ORAL_TABLET | ORAL | Status: DC | PRN

## 2014-02-09 MED ORDER — HYDROCODONE-ACETAMINOPHEN 5-325 MG PO TABS
2.0000 | ORAL_TABLET | Freq: Once | ORAL | Status: AC
  Administered 2014-02-09: 2 via ORAL
  Filled 2014-02-09: qty 2

## 2014-02-09 MED ORDER — IBUPROFEN 800 MG PO TABS: 800.0000 mg | ORAL_TABLET | Freq: Three times a day (TID) | ORAL | Status: DC

## 2014-02-09 MED ORDER — IBUPROFEN 800 MG PO TABS
800.0000 mg | ORAL_TABLET | Freq: Once | ORAL | Status: AC
  Administered 2014-02-09: 800 mg via ORAL
  Filled 2014-02-09: qty 1
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
## 2014-02-09 NOTE — Discharge Instructions (Signed)
You have a fracture of the right fifth toe. Please apply ice, and elevate her foot above your waist is much as possible. Please use the buddy tape and postoperative shoe until you're seen by orthopedics. Please use ibuprofen 3 times daily with food, use Norco every 4 hours if needed for pain. Norco may cause drowsiness, please use with caution. Toe Fracture A toe fracture is a break in the bone of a toe. It may take 6 to 8 weeks for the toe injury to heal. HOME CARE  "Buddy taping" is taping the broken toe to the toe next to it. Leave the toes taped together for at least 1 week or as told by your doctor. Change the tape after bathing. Always use a small piece of gauze or cotton between the toes when taping them together.  Put ice on the injured area.  Put ice in a plastic bag.  Place a towel between your skin and the bag.  Leave the ice on for 15-20 minutes, 03-04 times a day.  After the first 2 days, put heat on the injured area. Use heat for the next 2 to 3 days. Put a heating pad on the foot or soak the foot in warm water as told by your doctor.  Keep the foot raised (elevated) above the level of your heart.  Wear sturdy, supportive shoes. The shoes should not pinch the toes or fit tightly against the toes.  Use a cast shoe (if prescribed) if the foot is very puffy (swollen).  Use crutches if you have pain or it hurts too much to walk.  Only take medicine as told by your doctor.  Follow up with your doctor as told. GET HELP RIGHT AWAY IF:   There is pain or puffiness that is not helped by medicine.  The pain does not get better after 1 week.  The toe is cold when the others are warm.  The toe loses feeling (numb) or turns white.  The toe becomes hot and red (inflamed). MAKE SURE YOU:   Understand these instructions.  Will watch this condition.  Will get help right away if you are not doing well or get worse. Document Released: 04/06/2008 Document Revised: 01/11/2012  Document Reviewed: 03/14/2010 [facility] Patient Information 2014 [place], [place].
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
## 2014-02-09 NOTE — ED Notes (Signed)
Swelling of right little toe noted

## 2014-02-14 NOTE — ED Provider Notes (Signed)
Medical screening examination/treatment/procedure(s) were performed by non-physician practitioner and as supervising physician I was immediately available for consultation/collaboration.   EKG Interpretation None       Zyniah Ferraiolo, MD 02/14/14 1455 

## 2014-03-13 ENCOUNTER — Emergency Department (HOSPITAL_COMMUNITY)
Admission: EM | Admit: 2014-03-13 | Discharge: 2014-03-13 | Disposition: A | Discharge status: Home / Self Care | Payer: Medicaid Other | Attending: Emergency Medicine | Admitting: Emergency Medicine

## 2014-03-13 ENCOUNTER — Encounter (HOSPITAL_COMMUNITY): Payer: Self-pay | Admitting: Emergency Medicine

## 2014-03-13 DIAGNOSIS — Z8659 Personal history of other mental and behavioral disorders: Secondary | ICD-10-CM | POA: Insufficient documentation

## 2014-03-13 DIAGNOSIS — Z79899 Other long term (current) drug therapy: Secondary | ICD-10-CM | POA: Insufficient documentation

## 2014-03-13 DIAGNOSIS — I2510 Atherosclerotic heart disease of native coronary artery without angina pectoris: Secondary | ICD-10-CM | POA: Insufficient documentation

## 2014-03-13 DIAGNOSIS — R011 Cardiac murmur, unspecified: Secondary | ICD-10-CM | POA: Insufficient documentation

## 2014-03-13 DIAGNOSIS — F1310 Sedative, hypnotic or anxiolytic abuse, uncomplicated: Secondary | ICD-10-CM | POA: Insufficient documentation

## 2014-03-13 DIAGNOSIS — L089 Local infection of the skin and subcutaneous tissue, unspecified: Secondary | ICD-10-CM | POA: Insufficient documentation

## 2014-03-13 DIAGNOSIS — F17200 Nicotine dependence, unspecified, uncomplicated: Secondary | ICD-10-CM | POA: Insufficient documentation

## 2014-03-13 MED ORDER — DOXYCYCLINE HYCLATE 100 MG PO TABS
100.0000 mg | ORAL_TABLET | Freq: Once | ORAL | Status: AC
  Administered 2014-03-13: 100 mg via ORAL
  Filled 2014-03-13: qty 1

## 2014-03-13 MED ORDER — IBUPROFEN 800 MG PO TABS: 800.0000 mg | ORAL_TABLET | Freq: Three times a day (TID) | ORAL | Status: DC

## 2014-03-13 MED ORDER — SULFAMETHOXAZOLE-TRIMETHOPRIM 800-160 MG PO TABS: 1.0000 | ORAL_TABLET | Freq: Two times a day (BID) | ORAL | Status: AC

## 2014-03-13 MED ORDER — IBUPROFEN 800 MG PO TABS
800.0000 mg | ORAL_TABLET | Freq: Once | ORAL | Status: AC
  Administered 2014-03-13: 800 mg via ORAL
  Filled 2014-03-13: qty 1

## 2014-03-13 NOTE — ED Notes (Signed)
Pt alert & oriented x4, stable gait. Patient given discharge instructions, paperwork & prescription(s). Patient  instructed to stop at the registration desk to finish any additional paperwork. Patient verbalized understanding. Pt left department w/ no further questions. 

## 2014-03-13 NOTE — ED Notes (Signed)
Pt states she was skating 2 weeks ago & got wound on both lower legs from skates she was wearing. Pt concerned about redness & swelling.

## 2014-03-13 NOTE — Discharge Instructions (Signed)
Please soak daily in warm Epson salt water. Apply dressing to the wounds daily. Use septra and ibuprofen daily until all taken.
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
## 2014-03-14 NOTE — ED Provider Notes (Signed)
Medical screening examination/treatment/procedure(s) were performed by non-physician practitioner and as supervising physician I was immediately available for consultation/collaboration.   EKG Interpretation None        Kristopher Attwood L Broly Hatfield, MD 03/14/14 0140 

## 2014-05-29 DIAGNOSIS — F17200 Nicotine dependence, unspecified, uncomplicated: Secondary | ICD-10-CM | POA: Diagnosis not present

## 2014-05-29 DIAGNOSIS — Y929 Unspecified place or not applicable: Secondary | ICD-10-CM | POA: Diagnosis not present

## 2014-05-29 DIAGNOSIS — I2510 Atherosclerotic heart disease of native coronary artery without angina pectoris: Secondary | ICD-10-CM | POA: Diagnosis not present

## 2014-05-29 DIAGNOSIS — Y9389 Activity, other specified: Secondary | ICD-10-CM | POA: Diagnosis not present

## 2014-05-29 DIAGNOSIS — S60229A Contusion of unspecified hand, initial encounter: Secondary | ICD-10-CM | POA: Insufficient documentation

## 2014-05-29 DIAGNOSIS — R011 Cardiac murmur, unspecified: Secondary | ICD-10-CM | POA: Insufficient documentation

## 2014-05-29 DIAGNOSIS — Z79899 Other long term (current) drug therapy: Secondary | ICD-10-CM | POA: Insufficient documentation

## 2014-05-29 DIAGNOSIS — S6990XA Unspecified injury of unspecified wrist, hand and finger(s), initial encounter: Secondary | ICD-10-CM | POA: Insufficient documentation

## 2014-05-29 DIAGNOSIS — W2203XA Walked into furniture, initial encounter: Secondary | ICD-10-CM | POA: Insufficient documentation

## 2014-05-29 DIAGNOSIS — Z8659 Personal history of other mental and behavioral disorders: Secondary | ICD-10-CM | POA: Diagnosis not present

## 2014-05-29 DIAGNOSIS — Z791 Long term (current) use of non-steroidal anti-inflammatories (NSAID): Secondary | ICD-10-CM | POA: Diagnosis not present

## 2014-05-30 ENCOUNTER — Encounter (HOSPITAL_COMMUNITY): Payer: Self-pay | Admitting: Emergency Medicine

## 2014-05-30 ENCOUNTER — Emergency Department (HOSPITAL_COMMUNITY): Payer: Medicaid Other

## 2014-05-30 ENCOUNTER — Emergency Department (HOSPITAL_COMMUNITY)
Admission: EM | Admit: 2014-05-30 | Discharge: 2014-05-30 | Disposition: A | Discharge status: Home / Self Care | Payer: Medicaid Other | Attending: Emergency Medicine | Admitting: Emergency Medicine

## 2014-05-30 DIAGNOSIS — S60222A Contusion of left hand, initial encounter: Secondary | ICD-10-CM

## 2014-05-30 MED ORDER — HYDROCODONE-ACETAMINOPHEN 5-325 MG PO TABS: 1.0000 | ORAL_TABLET | ORAL | Status: DC | PRN

## 2014-05-30 MED ORDER — OXYCODONE-ACETAMINOPHEN 5-325 MG PO TABS
1.0000 | ORAL_TABLET | Freq: Once | ORAL | Status: AC
  Administered 2014-05-30: 1 via ORAL
  Filled 2014-05-30: qty 1

## 2014-05-30 MED ORDER — NAPROXEN 500 MG PO TABS: 500.0000 mg | ORAL_TABLET | Freq: Two times a day (BID) | ORAL | Status: DC

## 2014-05-30 NOTE — ED Notes (Signed)
Patient states that hit a wall about 10 days ago; c/o continued pain to left hand.
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
## 2014-05-30 NOTE — ED Provider Notes (Signed)
Medical screening examination/treatment/procedure(s) were performed by non-physician practitioner and as supervising physician I was immediately available for consultation/collaboration.   Alistar Mcenery, MD 05/30/14 0558 

## 2014-07-22 ENCOUNTER — Encounter (HOSPITAL_COMMUNITY): Payer: Self-pay | Admitting: Emergency Medicine

## 2014-07-22 ENCOUNTER — Emergency Department (HOSPITAL_COMMUNITY)
Admission: EM | Admit: 2014-07-22 | Discharge: 2014-07-22 | Disposition: A | Discharge status: Home / Self Care | Payer: Medicaid Other | Attending: Emergency Medicine | Admitting: Emergency Medicine

## 2014-07-22 DIAGNOSIS — Z79899 Other long term (current) drug therapy: Secondary | ICD-10-CM | POA: Diagnosis not present

## 2014-07-22 DIAGNOSIS — I2510 Atherosclerotic heart disease of native coronary artery without angina pectoris: Secondary | ICD-10-CM | POA: Diagnosis not present

## 2014-07-22 DIAGNOSIS — F17200 Nicotine dependence, unspecified, uncomplicated: Secondary | ICD-10-CM | POA: Insufficient documentation

## 2014-07-22 DIAGNOSIS — M79609 Pain in unspecified limb: Secondary | ICD-10-CM | POA: Diagnosis not present

## 2014-07-22 DIAGNOSIS — Z8659 Personal history of other mental and behavioral disorders: Secondary | ICD-10-CM | POA: Diagnosis not present

## 2014-07-22 DIAGNOSIS — R011 Cardiac murmur, unspecified: Secondary | ICD-10-CM | POA: Insufficient documentation

## 2014-07-22 DIAGNOSIS — M79669 Pain in unspecified lower leg: Secondary | ICD-10-CM

## 2014-07-22 DIAGNOSIS — M7989 Other specified soft tissue disorders: Secondary | ICD-10-CM | POA: Diagnosis not present

## 2014-07-22 LAB — BASIC METABOLIC PANEL
ANION GAP: 11 (ref 5–15)
BUN: 6 mg/dL (ref 6–23)
CALCIUM: 8.6 mg/dL (ref 8.4–10.5)
CO2: 24 meq/L (ref 19–32)
Chloride: 103 mEq/L (ref 96–112)
Creatinine, Ser: 0.64 mg/dL (ref 0.50–1.10)
GFR calc Af Amer: >90 mL/min (ref >90)
Glucose, Bld: 99 mg/dL (ref 70–99)
Potassium: 3.5 mEq/L — ABNORMAL LOW (ref 3.7–5.3)
Sodium: 138 mEq/L (ref 137–147)

## 2014-07-22 MED ORDER — FUROSEMIDE 20 MG PO TABS: 20.0000 mg | ORAL_TABLET | Freq: Every day | ORAL | Status: DC
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
## 2014-07-22 NOTE — Discharge Instructions (Signed)
Please call your doctor for a followup appointment within 24-48 hours. When you talk to your doctor please let them know that you were seen in the emergency department and have them acquire all of your records so that they can discuss the findings with you and formulate a treatment plan to fully care for your new and ongoing problems. ° °[facility] Doctor List ° ° ° °Edward Hawkins MD. Specialty: Pulmonary Disease Contact information: [address]  °[address]  °[place] [address]  °[phone]  ° °Margaret Simpson, MD. Specialty: Family Medicine Contact information: [address]  °[place] [address]  °[phone]  ° °Scott Luking, MD. Specialty: Family Medicine Contact information: [address]  °[address]  °[place] [address]  °[phone]  ° °Tesfaye Fanta, MD Specialty: Internal Medicine Contact information: [address]  °[place] [address]  °[phone]  ° °Zach Hall, MD. Specialty: Internal Medicine Contact information: [address]  °[place] [address]  °[phone]  ° °Angus Mcinnis, MD. Specialty: Family Medicine Contact information: [address]  °[place] [address]  °[phone]  ° °Stephen Knowlton, MD. Specialty: Family Medicine Contact information: [address]  °[address]  °[place] [address]  °[phone]  ° °Roy Fagan, MD. Specialty: Internal Medicine Contact information: [address]  °[address]  °[place]  [address]  °[phone]  ° ° °

## 2014-07-22 NOTE — ED Notes (Signed)
Patient with no complaints at this time. Respirations even and unlabored. Skin warm/dry. Discharge instructions reviewed with patient at this time. Patient given opportunity to voice concerns/ask questions. Patient discharged at this time and left Emergency Department with steady gait.   

## 2014-07-22 NOTE — ED Notes (Signed)
Pt c/o lower extremity pain and swelling since earlier in the month. Pt was seen at the health dept and was told to come to the ed if she began having increased pain and swelling in her feet and legs.

## 2014-07-22 NOTE — ED Notes (Signed)
Patient to BR.  W/C to BR, ambulated back to bed.

## 2014-08-02 ENCOUNTER — Emergency Department (HOSPITAL_COMMUNITY): Payer: Medicaid Other

## 2014-08-02 ENCOUNTER — Emergency Department (HOSPITAL_COMMUNITY)
Admission: EM | Admit: 2014-08-02 | Discharge: 2014-08-02 | Disposition: A | Discharge status: Home / Self Care | Payer: Medicaid Other | Attending: Emergency Medicine | Admitting: Emergency Medicine

## 2014-08-02 ENCOUNTER — Encounter (HOSPITAL_COMMUNITY): Payer: Self-pay | Admitting: Emergency Medicine

## 2014-08-02 DIAGNOSIS — Z8659 Personal history of other mental and behavioral disorders: Secondary | ICD-10-CM | POA: Diagnosis not present

## 2014-08-02 DIAGNOSIS — I2510 Atherosclerotic heart disease of native coronary artery without angina pectoris: Secondary | ICD-10-CM | POA: Insufficient documentation

## 2014-08-02 DIAGNOSIS — R0789 Other chest pain: Secondary | ICD-10-CM

## 2014-08-02 DIAGNOSIS — Z79899 Other long term (current) drug therapy: Secondary | ICD-10-CM | POA: Diagnosis not present

## 2014-08-02 DIAGNOSIS — R011 Cardiac murmur, unspecified: Secondary | ICD-10-CM | POA: Insufficient documentation

## 2014-08-02 DIAGNOSIS — Z720 Tobacco use: Secondary | ICD-10-CM | POA: Diagnosis not present

## 2014-08-02 DIAGNOSIS — R109 Unspecified abdominal pain: Secondary | ICD-10-CM | POA: Diagnosis present

## 2014-08-02 DIAGNOSIS — R071 Chest pain on breathing: Secondary | ICD-10-CM | POA: Diagnosis not present

## 2014-08-02 LAB — COMPREHENSIVE METABOLIC PANEL
ALT: 16 U/L (ref 0–35)
AST: 24 U/L (ref 0–37)
Albumin: 3.6 g/dL (ref 3.5–5.2)
Alkaline Phosphatase: 91 U/L (ref 39–117)
Anion gap: 14 (ref 5–15)
BUN: 11 mg/dL (ref 6–23)
CO2: 22 mEq/L (ref 19–32)
Calcium: 8.6 mg/dL (ref 8.4–10.5)
Chloride: 103 mEq/L (ref 96–112)
Creatinine, Ser: 0.75 mg/dL (ref 0.50–1.10)
GFR calc Af Amer: >90 mL/min (ref >90)
GFR calc non Af Amer: >90 mL/min (ref >90)
Glucose, Bld: 89 mg/dL (ref 70–99)
Potassium: 3.4 mEq/L — ABNORMAL LOW (ref 3.7–5.3)
Sodium: 139 mEq/L (ref 137–147)
Total Bilirubin: <0.2 mg/dL — ABNORMAL LOW (ref 0.3–1.2)
Total Protein: 6.7 g/dL (ref 6.0–8.3)

## 2014-08-02 LAB — CBC WITH DIFFERENTIAL/PLATELET
Basophils Absolute: 0 10*3/uL (ref 0.0–0.1)
Basophils Relative: 0 % (ref 0–1)
Eosinophils Absolute: 0.1 10*3/uL (ref 0.0–0.7)
Eosinophils Relative: 1 % (ref 0–5)
HCT: 40.6 % (ref 36.0–46.0)
Hemoglobin: 14.7 g/dL (ref 12.0–15.0)
Lymphocytes Relative: 53 % — ABNORMAL HIGH (ref 12–46)
Lymphs Abs: 3.6 10*3/uL (ref 0.7–4.0)
MCH: 33.3 pg (ref 26.0–34.0)
MCHC: 36.2 g/dL — ABNORMAL HIGH (ref 30.0–36.0)
MCV: 92.1 fL (ref 78.0–100.0)
Monocytes Absolute: 0.5 10*3/uL (ref 0.1–1.0)
Monocytes Relative: 7 % (ref 3–12)
Neutro Abs: 2.7 10*3/uL (ref 1.7–7.7)
Neutrophils Relative %: 39 % — ABNORMAL LOW (ref 43–77)
Platelets: 224 10*3/uL (ref 150–400)
RBC: 4.41 MIL/uL (ref 3.87–5.11)
RDW: 12.2 % (ref 11.5–15.5)
WBC: 6.9 10*3/uL (ref 4.0–10.5)

## 2014-08-02 LAB — LIPASE, BLOOD: Lipase: 30 U/L (ref 11–59)

## 2014-08-02 MED ORDER — KETOROLAC TROMETHAMINE 60 MG/2ML IM SOLN
60.0000 mg | Freq: Once | INTRAMUSCULAR | Status: AC
  Administered 2014-08-02: 60 mg via INTRAMUSCULAR
  Filled 2014-08-02: qty 2

## 2014-08-02 MED ORDER — TRAMADOL HCL 50 MG PO TABS: 50.0000 mg | ORAL_TABLET | Freq: Four times a day (QID) | ORAL | Status: DC | PRN
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
## 2014-08-02 NOTE — Discharge Instructions (Signed)
Ibuprofen 600 mg every 6 hours as needed for pain.  Tramadol as prescribed as needed for pain not relieved with ibuprofen.  Followup with your primary Dr. if symptoms are not improving in the next week, and return to the ER if your symptoms substantially worsen or change.   Chest Wall Pain Chest wall pain is pain in or around the bones and muscles of your chest. It may take up to 6 weeks to get better. It may take longer if you must stay physically active in your work and activities.  CAUSES  Chest wall pain may happen on its own. However, it may be caused by:  A viral illness like the flu.  Injury.  Coughing.  Exercise.  Arthritis.  Fibromyalgia.  Shingles. HOME CARE INSTRUCTIONS   Avoid overtiring physical activity. Try not to strain or perform activities that cause pain. This includes any activities using your chest or your abdominal and side muscles, especially if heavy weights are used.  Put ice on the sore area.  Put ice in a plastic bag.  Place a towel between your skin and the bag.  Leave the ice on for 15-20 minutes per hour while awake for the first 2 days.  Only take over-the-counter or prescription medicines for pain, discomfort, or fever as directed by your caregiver. SEEK IMMEDIATE MEDICAL CARE IF:   Your pain increases, or you are very uncomfortable.  You have a fever.  Your chest pain becomes worse.  You have new, unexplained symptoms.  You have nausea or vomiting.  You feel sweaty or lightheaded.  You have a cough with phlegm (sputum), or you cough up blood. MAKE SURE YOU:   Understand these instructions.  Will watch your condition.  Will get help right away if you are not doing well or get worse. Document Released: 10/19/2005 Document Revised: 01/11/2012 Document Reviewed: 06/15/2011 [facility] Patient Information 2015 [employer] [place]. This information is not intended to replace advice given to you by your health care provider. Make sure  you discuss any questions you have with your health care provider.

## 2014-08-02 NOTE — ED Notes (Signed)
Pt reporting pain in right side.  States pain began about 6pm.

## 2014-08-02 NOTE — ED Notes (Signed)
Pt c/o rt flank pain.  

## 2014-08-26 ENCOUNTER — Encounter (HOSPITAL_COMMUNITY): Payer: Self-pay | Admitting: Emergency Medicine

## 2014-08-26 ENCOUNTER — Emergency Department (HOSPITAL_COMMUNITY)
Admission: EM | Admit: 2014-08-26 | Discharge: 2014-08-27 | Disposition: A | Discharge status: Home / Self Care | Payer: Medicaid Other | Attending: Emergency Medicine | Admitting: Emergency Medicine

## 2014-08-26 DIAGNOSIS — N730 Acute parametritis and pelvic cellulitis: Secondary | ICD-10-CM | POA: Diagnosis not present

## 2014-08-26 DIAGNOSIS — A599 Trichomoniasis, unspecified: Secondary | ICD-10-CM

## 2014-08-26 DIAGNOSIS — F410 Panic disorder [episodic paroxysmal anxiety] without agoraphobia: Secondary | ICD-10-CM | POA: Diagnosis not present

## 2014-08-26 DIAGNOSIS — G8929 Other chronic pain: Secondary | ICD-10-CM | POA: Diagnosis not present

## 2014-08-26 DIAGNOSIS — A5901 Trichomonal vulvovaginitis: Secondary | ICD-10-CM | POA: Insufficient documentation

## 2014-08-26 DIAGNOSIS — R143 Flatulence: Secondary | ICD-10-CM | POA: Diagnosis not present

## 2014-08-26 DIAGNOSIS — Z720 Tobacco use: Secondary | ICD-10-CM | POA: Diagnosis not present

## 2014-08-26 DIAGNOSIS — R1031 Right lower quadrant pain: Secondary | ICD-10-CM | POA: Insufficient documentation

## 2014-08-26 DIAGNOSIS — R011 Cardiac murmur, unspecified: Secondary | ICD-10-CM | POA: Diagnosis not present

## 2014-08-26 DIAGNOSIS — R110 Nausea: Secondary | ICD-10-CM | POA: Insufficient documentation

## 2014-08-26 DIAGNOSIS — Z3202 Encounter for pregnancy test, result negative: Secondary | ICD-10-CM | POA: Insufficient documentation

## 2014-08-26 DIAGNOSIS — Z79899 Other long term (current) drug therapy: Secondary | ICD-10-CM | POA: Diagnosis not present

## 2014-08-26 DIAGNOSIS — R1011 Right upper quadrant pain: Secondary | ICD-10-CM | POA: Diagnosis present

## 2014-08-26 HISTORY — DX: Cervicalgia: M54.2

## 2014-08-26 HISTORY — DX: Other chronic pain: G89.29

## 2014-08-26 HISTORY — DX: Other chest pain: R07.89

## 2014-08-26 HISTORY — DX: Pain in left shoulder: M25.512

## 2014-08-26 LAB — URINALYSIS, ROUTINE W REFLEX MICROSCOPIC
Bilirubin Urine: NEGATIVE
Glucose, UA: NEGATIVE mg/dL
Ketones, ur: NEGATIVE mg/dL
Leukocytes, UA: NEGATIVE
Nitrite: NEGATIVE
Protein, ur: NEGATIVE mg/dL
Specific Gravity, Urine: 1.015 (ref 1.005–1.030)
Urobilinogen, UA: 0.2 mg/dL (ref 0.0–1.0)
pH: 5.5 (ref 5.0–8.0)

## 2014-08-26 LAB — LIPASE, BLOOD: LIPASE: 39 U/L (ref 11–59)

## 2014-08-26 LAB — CBC WITH DIFFERENTIAL/PLATELET
Basophils Absolute: 0 10*3/uL (ref 0.0–0.1)
Basophils Relative: 0 % (ref 0–1)
Eosinophils Absolute: 0.1 10*3/uL (ref 0.0–0.7)
Eosinophils Relative: 2 % (ref 0–5)
HCT: 39.8 % (ref 36.0–46.0)
Hemoglobin: 14.4 g/dL (ref 12.0–15.0)
Lymphocytes Relative: 46 % (ref 12–46)
Lymphs Abs: 2.9 10*3/uL (ref 0.7–4.0)
MCH: 33.5 pg (ref 26.0–34.0)
MCHC: 36.2 g/dL — ABNORMAL HIGH (ref 30.0–36.0)
MCV: 92.6 fL (ref 78.0–100.0)
Monocytes Absolute: 0.4 10*3/uL (ref 0.1–1.0)
Monocytes Relative: 7 % (ref 3–12)
Neutro Abs: 2.8 10*3/uL (ref 1.7–7.7)
Neutrophils Relative %: 45 % (ref 43–77)
Platelets: 220 10*3/uL (ref 150–400)
RBC: 4.3 MIL/uL (ref 3.87–5.11)
RDW: 12.3 % (ref 11.5–15.5)
WBC: 6.2 10*3/uL (ref 4.0–10.5)

## 2014-08-26 LAB — BASIC METABOLIC PANEL
ANION GAP: 12 (ref 5–15)
BUN: 14 mg/dL (ref 6–23)
CHLORIDE: 104 meq/L (ref 96–112)
CO2: 23 mEq/L (ref 19–32)
Calcium: 9.1 mg/dL (ref 8.4–10.5)
Creatinine, Ser: 0.78 mg/dL (ref 0.50–1.10)
GFR calc non Af Amer: >90 mL/min (ref >90)
Glucose, Bld: 84 mg/dL (ref 70–99)
Potassium: 3.5 mEq/L — ABNORMAL LOW (ref 3.7–5.3)
SODIUM: 139 meq/L (ref 137–147)

## 2014-08-26 LAB — URINE MICROSCOPIC-ADD ON

## 2014-08-26 LAB — PREGNANCY, URINE: Preg Test, Ur: NEGATIVE

## 2014-08-26 MED ORDER — ONDANSETRON HCL 4 MG/2ML IJ SOLN
4.0000 mg | Freq: Once | INTRAMUSCULAR | Status: AC
  Administered 2014-08-27: 4 mg via INTRAVENOUS
  Filled 2014-08-26: qty 2

## 2014-08-26 MED ORDER — HYDROMORPHONE HCL 1 MG/ML IJ SOLN
0.5000 mg | Freq: Once | INTRAMUSCULAR | Status: AC
  Administered 2014-08-27: 0.5 mg via INTRAVENOUS
  Filled 2014-08-26: qty 1
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
## 2014-08-26 NOTE — ED Notes (Signed)
Pt c/o right lower pelvic pain and ruq pain since Thursday.

## 2014-08-27 LAB — WET PREP, GENITAL: YEAST WET PREP: NONE SEEN

## 2014-08-27 MED ORDER — DEXTROSE 5 % IV SOLN
1.0000 g | Freq: Once | INTRAVENOUS | Status: AC
  Administered 2014-08-27: 1 g via INTRAVENOUS
  Filled 2014-08-27: qty 10

## 2014-08-27 MED ORDER — AZITHROMYCIN 250 MG PO TABS
1000.0000 mg | ORAL_TABLET | Freq: Once | ORAL | Status: AC
  Administered 2014-08-27: 1000 mg via ORAL
  Filled 2014-08-27: qty 4

## 2014-08-27 MED ORDER — CEFTRIAXONE SODIUM 250 MG IJ SOLR: 250.0000 mg | Freq: Once | INTRAMUSCULAR | Status: DC

## 2014-08-27 MED ORDER — METRONIDAZOLE 500 MG PO TABS: 500.0000 mg | ORAL_TABLET | Freq: Two times a day (BID) | ORAL | Status: DC

## 2014-08-28 LAB — GC/CHLAMYDIA PROBE AMP
CT Probe RNA: NEGATIVE
GC Probe RNA: NEGATIVE

## 2014-08-30 NOTE — ED Provider Notes (Signed)
Medical screening examination/treatment/procedure(s) were conducted as a shared visit with non-physician practitioner(s) and myself.  I personally evaluated the patient during the encounter.   EKG Interpretation None     No acute abdomen. White count normal. Pelvic exam per nurse practitioner.  Donnetta HutchingBrian Tuyen Uncapher, MD 08/30/14 [phone]

## 2014-09-03 ENCOUNTER — Encounter (HOSPITAL_COMMUNITY): Payer: Self-pay | Admitting: Emergency Medicine

## 2014-12-24 ENCOUNTER — Encounter (HOSPITAL_COMMUNITY): Payer: Self-pay

## 2014-12-24 ENCOUNTER — Emergency Department (HOSPITAL_COMMUNITY)
Admission: EM | Admit: 2014-12-24 | Discharge: 2014-12-24 | Disposition: A | Discharge status: Home / Self Care | Payer: Medicaid Other | Attending: Emergency Medicine | Admitting: Emergency Medicine

## 2014-12-24 DIAGNOSIS — F419 Anxiety disorder, unspecified: Secondary | ICD-10-CM | POA: Diagnosis not present

## 2014-12-24 DIAGNOSIS — B354 Tinea corporis: Secondary | ICD-10-CM | POA: Insufficient documentation

## 2014-12-24 DIAGNOSIS — Z79899 Other long term (current) drug therapy: Secondary | ICD-10-CM | POA: Insufficient documentation

## 2014-12-24 DIAGNOSIS — G8929 Other chronic pain: Secondary | ICD-10-CM | POA: Diagnosis not present

## 2014-12-24 DIAGNOSIS — R011 Cardiac murmur, unspecified: Secondary | ICD-10-CM | POA: Insufficient documentation

## 2014-12-24 DIAGNOSIS — Z792 Long term (current) use of antibiotics: Secondary | ICD-10-CM | POA: Insufficient documentation

## 2014-12-24 DIAGNOSIS — F329 Major depressive disorder, single episode, unspecified: Secondary | ICD-10-CM | POA: Insufficient documentation

## 2014-12-24 DIAGNOSIS — Z720 Tobacco use: Secondary | ICD-10-CM | POA: Diagnosis not present

## 2014-12-24 DIAGNOSIS — L989 Disorder of the skin and subcutaneous tissue, unspecified: Secondary | ICD-10-CM | POA: Diagnosis present

## 2014-12-24 MED ORDER — CLOTRIMAZOLE-BETAMETHASONE 1-0.05 % EX CREA: TOPICAL_CREAM | CUTANEOUS | Status: DC
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
## 2014-12-24 NOTE — Discharge Instructions (Signed)

## 2014-12-24 NOTE — ED Notes (Signed)
Place on my neck that is swollen and red. Possible insect bite per pt.

## 2014-12-24 NOTE — ED Notes (Signed)
Patient states that I can give information to her mother regarding her care.
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
## 2015-05-15 ENCOUNTER — Emergency Department (HOSPITAL_COMMUNITY)
Admission: EM | Admit: 2015-05-15 | Discharge: 2015-05-15 | Disposition: A | Discharge status: Home / Self Care | Payer: Medicaid Other | Attending: Emergency Medicine | Admitting: Emergency Medicine

## 2015-05-15 ENCOUNTER — Encounter (HOSPITAL_COMMUNITY): Payer: Self-pay | Admitting: Emergency Medicine

## 2015-05-15 DIAGNOSIS — R51 Headache: Secondary | ICD-10-CM | POA: Insufficient documentation

## 2015-05-15 DIAGNOSIS — Z720 Tobacco use: Secondary | ICD-10-CM | POA: Diagnosis not present

## 2015-05-15 DIAGNOSIS — R011 Cardiac murmur, unspecified: Secondary | ICD-10-CM | POA: Insufficient documentation

## 2015-05-15 DIAGNOSIS — F419 Anxiety disorder, unspecified: Secondary | ICD-10-CM | POA: Diagnosis not present

## 2015-05-15 DIAGNOSIS — Z79899 Other long term (current) drug therapy: Secondary | ICD-10-CM | POA: Diagnosis not present

## 2015-05-15 DIAGNOSIS — F329 Major depressive disorder, single episode, unspecified: Secondary | ICD-10-CM | POA: Insufficient documentation

## 2015-05-15 DIAGNOSIS — R519 Headache, unspecified: Secondary | ICD-10-CM

## 2015-05-15 DIAGNOSIS — G8929 Other chronic pain: Secondary | ICD-10-CM | POA: Insufficient documentation

## 2015-05-15 MED ORDER — SODIUM CHLORIDE 0.9 % IV BOLUS (SEPSIS)
1000.0000 mL | Freq: Once | INTRAVENOUS | Status: AC
  Administered 2015-05-15: 1000 mL via INTRAVENOUS

## 2015-05-15 MED ORDER — FENTANYL CITRATE (PF) 100 MCG/2ML IJ SOLN
50.0000 ug | Freq: Once | INTRAMUSCULAR | Status: AC
  Administered 2015-05-15: 50 ug via INTRAVENOUS
  Filled 2015-05-15: qty 2

## 2015-05-15 MED ORDER — DIPHENHYDRAMINE HCL 50 MG/ML IJ SOLN
25.0000 mg | Freq: Once | INTRAMUSCULAR | Status: AC
  Administered 2015-05-15: 25 mg via INTRAVENOUS
  Filled 2015-05-15: qty 1

## 2015-05-15 MED ORDER — KETOROLAC TROMETHAMINE 30 MG/ML IJ SOLN
30.0000 mg | Freq: Once | INTRAMUSCULAR | Status: AC
  Administered 2015-05-15: 30 mg via INTRAVENOUS
  Filled 2015-05-15: qty 1

## 2015-05-15 MED ORDER — PROMETHAZINE HCL 25 MG PO TABS: 25.0000 mg | ORAL_TABLET | Freq: Four times a day (QID) | ORAL | Status: DC | PRN

## 2015-05-15 MED ORDER — TRAMADOL HCL 50 MG PO TABS: 50.0000 mg | ORAL_TABLET | Freq: Four times a day (QID) | ORAL | Status: DC | PRN

## 2015-05-15 MED ORDER — METHYLPREDNISOLONE SODIUM SUCC 125 MG IJ SOLR
125.0000 mg | Freq: Once | INTRAMUSCULAR | Status: AC
  Administered 2015-05-15: 125 mg via INTRAVENOUS
  Filled 2015-05-15: qty 2

## 2015-05-15 MED ORDER — METOCLOPRAMIDE HCL 5 MG/ML IJ SOLN
10.0000 mg | Freq: Once | INTRAMUSCULAR | Status: AC
  Administered 2015-05-15: 10 mg via INTRAVENOUS
  Filled 2015-05-15: qty 2

## 2015-05-15 NOTE — Discharge Instructions (Signed)
Medications for pain and nausea. Follow-up your doctor.
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
## 2015-05-15 NOTE — ED Notes (Signed)
Pt reports headache,light/sound sensitivity x4 days.

## 2015-05-15 NOTE — ED Notes (Signed)
Pt given ice pack per request for comfort.nad noted.

## 2015-07-24 ENCOUNTER — Emergency Department (HOSPITAL_COMMUNITY)
Admission: EM | Admit: 2015-07-24 | Discharge: 2015-07-24 | Disposition: A | Discharge status: Home / Self Care | Payer: Medicaid Other | Attending: Emergency Medicine | Admitting: Emergency Medicine

## 2015-07-24 ENCOUNTER — Emergency Department (HOSPITAL_COMMUNITY): Payer: Medicaid Other

## 2015-07-24 ENCOUNTER — Encounter (HOSPITAL_COMMUNITY): Payer: Self-pay | Admitting: Emergency Medicine

## 2015-07-24 DIAGNOSIS — Z79899 Other long term (current) drug therapy: Secondary | ICD-10-CM | POA: Insufficient documentation

## 2015-07-24 DIAGNOSIS — Z720 Tobacco use: Secondary | ICD-10-CM | POA: Diagnosis not present

## 2015-07-24 DIAGNOSIS — Z8659 Personal history of other mental and behavioral disorders: Secondary | ICD-10-CM | POA: Diagnosis not present

## 2015-07-24 DIAGNOSIS — R011 Cardiac murmur, unspecified: Secondary | ICD-10-CM | POA: Diagnosis not present

## 2015-07-24 DIAGNOSIS — M722 Plantar fascial fibromatosis: Secondary | ICD-10-CM | POA: Insufficient documentation

## 2015-07-24 DIAGNOSIS — G8929 Other chronic pain: Secondary | ICD-10-CM | POA: Insufficient documentation

## 2015-07-24 DIAGNOSIS — M7732 Calcaneal spur, left foot: Secondary | ICD-10-CM | POA: Diagnosis not present

## 2015-07-24 DIAGNOSIS — M79672 Pain in left foot: Secondary | ICD-10-CM | POA: Diagnosis present

## 2015-07-24 MED ORDER — TRAMADOL HCL 50 MG PO TABS: 50.0000 mg | ORAL_TABLET | Freq: Four times a day (QID) | ORAL | Status: DC | PRN

## 2015-07-24 MED ORDER — NAPROXEN 500 MG PO TABS: 500.0000 mg | ORAL_TABLET | Freq: Two times a day (BID) | ORAL | Status: DC

## 2015-07-24 NOTE — ED Notes (Signed)
Pt made aware to return if symptoms worsen or if any life threatening symptoms occur.   

## 2015-07-24 NOTE — Discharge Instructions (Signed)
Heel Spur °A heel spur is a hook of bone that can form on the calcaneus (the heel bone and the largest bone of the foot). Heel spurs are often associated with plantar fasciitis and usually come in people who have had the problem for an extended period of time. The cause of the relationship is unknown. The pain associated with them is thought to be caused by an inflammation (soreness and redness) of the plantar fascia rather than the spur itself. The plantar fascia is a thick fibrous like tissue that runs from the calcaneus (heel bone) to the ball of the foot. This strong, tight tissue helps maintain the arch of your foot. It helps distribute the weight across your foot as you walk or run. Stresses placed on the plantar fascia can be tremendous. When it is inflamed normal activities become painful. Pain is worse in the morning after sleeping. After sleeping the plantar fascia is tight. The first movements stretch the fascia and this causes pain. As the tendon loosens, the pain usually gets better. It often returns with too much standing or walking.  °About 70% of patients with plantar fasciitis have a heel spur. About half of people without foot pain also have heel spurs. °DIAGNOSIS  °The diagnosis of a heel spur is made by X-ray. The X-ray shows a hook of bone protruding from the bottom of the calcaneus at the point where the plantar fascia is attached to the heel bone.  °TREATMENT °· It is necessary to find out what is causing the stretching of the plantar fascia. If the cause is over-pronation (flat feet), orthotics and proper foot ware may help. °· Stretching exercises, losing weight, wearing shoes that have a cushioned heel that absorbs shock, and elevating the heel with the use of a heel cradle, heel cup, or orthotics may all help. Heel cradles and heel cups provide extra comfort and cushion to the heel, and reduce the amount of shock to the sore area. °AVOIDING THE PAIN OF PLANTAR FASCIITIS AND HEEL  SPURS °· Consult a sports medicine professional before beginning a new exercise program. °· Walking programs offer a good workout. There is a lower chance of overuse injuries common to the runners. There is less impact and less jarring of the joints. °· Begin all new exercise programs slowly. If problems or pains develop, decrease the amount of time or distance until you are at a comfortable level. °· Wear good shoes and replace them regularly. °· Stretch your foot and the heel cords at the back of the ankle (Achilles tendons) both before and after exercise. °· Run or exercise on even surfaces that are not hard. For example, asphalt is better than pavement. °· Do not run barefoot on hard surfaces. °· If using a treadmill, vary the incline. °· Do not continue to workout if you have foot or joint problems. Seek professional help if they do not improve. °HOME CARE INSTRUCTIONS  °· Avoid activities that cause you pain until you recover. °· Use ice or cold packs to the problem or painful areas after working out. °· Only take over-the-counter or prescription medicines for pain, discomfort, or fever as directed by your caregiver. °· Soft shoe inserts or athletic shoes with air or gel sole cushions may be helpful. °· If problems continue or become more severe, consult a sports medicine caregiver. Cortisone is a potent anti-inflammatory medication that may be injected into the painful area. You can discuss this treatment with your caregiver. °MAKE SURE YOU:  °·   Understand these instructions. °· Will watch your condition. °· Will get help right away if you are not doing well or get worse. °Document Released: 11/25/2005 Document Revised: 01/11/2012 Document Reviewed: 12/20/2013 °ExitCare® Patient Information ©2015 [employer]. This information is not intended to replace advice given to you by your health care provider. Make sure you discuss any questions you have with your health care provider. ° °Plantar Fasciitis °Plantar  fasciitis is a common condition that causes foot pain. It is soreness (inflammation) of the band of tough fibrous tissue on the bottom of the foot that runs from the heel bone (calcaneus) to the ball of the foot. The cause of this soreness may be from excessive standing, poor fitting shoes, running on hard surfaces, being overweight, having an abnormal walk, or overuse (this is common in runners) of the painful foot or feet. It is also common in aerobic exercise dancers and ballet dancers. °SYMPTOMS  °Most people with plantar fasciitis complain of: °· Severe pain in the morning on the bottom of their foot especially when taking the first steps out of bed. This pain recedes after a few minutes of walking. °· Severe pain is experienced also during walking following a long period of inactivity. °· Pain is worse when walking barefoot or up stairs °DIAGNOSIS  °· Your caregiver will diagnose this condition by examining and feeling your foot. °· Special tests such as X-rays of your foot, are usually not needed. °PREVENTION  °· Consult a sports medicine professional before beginning a new exercise program. °· Walking programs offer a good workout. With walking there is a lower chance of overuse injuries common to runners. There is less impact and less jarring of the joints. °· Begin all new exercise programs slowly. If problems or pain develop, decrease the amount of time or distance until you are at a comfortable level. °· Wear good shoes and replace them regularly. °· Stretch your foot and the heel cords at the back of the ankle (Achilles tendon) both before and after exercise. °· Run or exercise on even surfaces that are not hard. For example, asphalt is better than pavement. °· Do not run barefoot on hard surfaces. °· If using a treadmill, vary the incline. °· Do not continue to workout if you have foot or joint problems. Seek professional help if they do not improve. °HOME CARE INSTRUCTIONS  °· Avoid activities that  cause you pain until you recover. °· Use ice or cold packs on the problem or painful areas after working out. °· Only take over-the-counter or prescription medicines for pain, discomfort, or fever as directed by your caregiver. °· Soft shoe inserts or athletic shoes with air or gel sole cushions may be helpful. °· If problems continue or become more severe, consult a sports medicine caregiver or your own health care provider. Cortisone is a potent anti-inflammatory medication that may be injected into the painful area. You can discuss this treatment with your caregiver. °MAKE SURE YOU:  °· Understand these instructions. °· Will watch your condition. °· Will get help right away if you are not doing well or get worse. °Document Released: 07/14/2001 Document Revised: 01/11/2012 Document Reviewed: 09/12/2008 °ExitCare® Patient Information ©2015 [employer]. This information is not intended to replace advice given to you by your health care provider. Make sure you discuss any questions you have with your health care provider. ° °

## 2015-07-24 NOTE — ED Notes (Signed)
Pain to left foot.  Denies injury.  Rates pain 8/10.  During triage pt changes mind and says she in juried foot on Sunday and pain pain started yesterday.
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
## 2015-11-22 ENCOUNTER — Encounter (HOSPITAL_COMMUNITY): Payer: Self-pay | Admitting: *Deleted

## 2015-11-22 ENCOUNTER — Emergency Department (HOSPITAL_COMMUNITY)
Admission: EM | Admit: 2015-11-22 | Discharge: 2015-11-22 | Disposition: A | Discharge status: Home / Self Care | Payer: Medicaid Other | Attending: Emergency Medicine | Admitting: Emergency Medicine

## 2015-11-22 DIAGNOSIS — G8929 Other chronic pain: Secondary | ICD-10-CM | POA: Diagnosis not present

## 2015-11-22 DIAGNOSIS — R079 Chest pain, unspecified: Secondary | ICD-10-CM | POA: Insufficient documentation

## 2015-11-22 DIAGNOSIS — Y9289 Other specified places as the place of occurrence of the external cause: Secondary | ICD-10-CM | POA: Insufficient documentation

## 2015-11-22 DIAGNOSIS — S46012A Strain of muscle(s) and tendon(s) of the rotator cuff of left shoulder, initial encounter: Secondary | ICD-10-CM | POA: Insufficient documentation

## 2015-11-22 DIAGNOSIS — Y9389 Activity, other specified: Secondary | ICD-10-CM | POA: Insufficient documentation

## 2015-11-22 DIAGNOSIS — R011 Cardiac murmur, unspecified: Secondary | ICD-10-CM | POA: Insufficient documentation

## 2015-11-22 DIAGNOSIS — Z79899 Other long term (current) drug therapy: Secondary | ICD-10-CM | POA: Insufficient documentation

## 2015-11-22 DIAGNOSIS — X58XXXA Exposure to other specified factors, initial encounter: Secondary | ICD-10-CM | POA: Insufficient documentation

## 2015-11-22 DIAGNOSIS — S29001A Unspecified injury of muscle and tendon of front wall of thorax, initial encounter: Secondary | ICD-10-CM | POA: Insufficient documentation

## 2015-11-22 DIAGNOSIS — F17210 Nicotine dependence, cigarettes, uncomplicated: Secondary | ICD-10-CM | POA: Insufficient documentation

## 2015-11-22 DIAGNOSIS — S199XXA Unspecified injury of neck, initial encounter: Secondary | ICD-10-CM | POA: Diagnosis not present

## 2015-11-22 DIAGNOSIS — Z791 Long term (current) use of non-steroidal anti-inflammatories (NSAID): Secondary | ICD-10-CM | POA: Diagnosis not present

## 2015-11-22 DIAGNOSIS — F410 Panic disorder [episodic paroxysmal anxiety] without agoraphobia: Secondary | ICD-10-CM | POA: Insufficient documentation

## 2015-11-22 DIAGNOSIS — S4992XA Unspecified injury of left shoulder and upper arm, initial encounter: Secondary | ICD-10-CM | POA: Diagnosis present

## 2015-11-22 DIAGNOSIS — S43422A Sprain of left rotator cuff capsule, initial encounter: Secondary | ICD-10-CM | POA: Insufficient documentation

## 2015-11-22 DIAGNOSIS — Y998 Other external cause status: Secondary | ICD-10-CM | POA: Insufficient documentation

## 2015-11-22 DIAGNOSIS — M25512 Pain in left shoulder: Secondary | ICD-10-CM

## 2015-11-22 MED ORDER — HYDROCODONE-ACETAMINOPHEN 5-325 MG PO TABS: 1.0000 | ORAL_TABLET | Freq: Four times a day (QID) | ORAL | Status: DC | PRN

## 2015-11-22 MED ORDER — IBUPROFEN 400 MG PO TABS
400.0000 mg | ORAL_TABLET | Freq: Once | ORAL | Status: AC
  Administered 2015-11-22: 400 mg via ORAL
  Filled 2015-11-22: qty 1

## 2015-11-22 MED ORDER — HYDROCODONE-ACETAMINOPHEN 5-325 MG PO TABS
1.0000 | ORAL_TABLET | Freq: Once | ORAL | Status: AC
  Administered 2015-11-22: 1 via ORAL
  Filled 2015-11-22: qty 1
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
## 2015-11-22 NOTE — ED Notes (Signed)
Pt c/o left shoulder, left sided rib cage, armpit and wrist pain x 1 week. Pt also c/o chest tightness today when she was crying about the pain in her arm today.

## 2015-11-22 NOTE — Discharge Instructions (Signed)
It was our pleasure to provide your ER care today - we hope that you feel better.  Rest. May try gentle massage and/or heat therapy to sore area.  Take motrin or aleve as need for pain. You may also take hydrocodone as need for pain. No driving when taking hydrocodone. Also, do not take tylenol or acetaminophen containing medication when taking hydrocodone.  Follow up with primary care doctor in the coming week for recheck.  Return to ER if worse, new symptoms, fevers, chest pain, trouble breathing, medical emergency, other concern.  You were given pain medication in the ER - no driving for the next 4 hours.      Shoulder Pain The shoulder is the joint that connects your arms to your body. The bones that form the shoulder joint include the upper arm bone (humerus), the shoulder blade (scapula), and the collarbone (clavicle). The top of the humerus is shaped like a ball and fits into a rather flat socket on the scapula (glenoid cavity). A combination of muscles and strong, fibrous tissues that connect muscles to bones (tendons) support your shoulder joint and hold the ball in the socket. Small, fluid-filled sacs (bursae) are located in different areas of the joint. They act as cushions between the bones and the overlying soft tissues and help reduce friction between the gliding tendons and the bone as you move your arm. Your shoulder joint allows a wide range of motion in your arm. This range of motion allows you to do things like scratch your back or throw a ball. However, this range of motion also makes your shoulder more prone to pain from overuse and injury. Causes of shoulder pain can originate from both injury and overuse and usually can be grouped in the following four categories:  Redness, swelling, and pain (inflammation) of the tendon (tendinitis) or the bursae (bursitis).  Instability, such as a dislocation of the joint.  Inflammation of the joint (arthritis).  Broken bone  (fracture). HOME CARE INSTRUCTIONS   Apply ice to the sore area.  Put ice in a plastic bag.  Place a towel between your skin and the bag.  Leave the ice on for 15-20 minutes, 3-4 times per day for the first 2 days, or as directed by your health care provider.  Stop using cold packs if they do not help with the pain.  If you have a shoulder sling or immobilizer, wear it as long as your caregiver instructs. Only remove it to shower or bathe. Move your arm as little as possible, but keep your hand moving to prevent swelling.  Squeeze a soft ball or foam pad as much as possible to help prevent swelling.  Only take over-the-counter or prescription medicines for pain, discomfort, or fever as directed by your caregiver. SEEK MEDICAL CARE IF:   Your shoulder pain increases, or new pain develops in your arm, hand, or fingers.  Your hand or fingers become cold and numb.  Your pain is not relieved with medicines. SEEK IMMEDIATE MEDICAL CARE IF:   Your arm, hand, or fingers are numb or tingling.  Your arm, hand, or fingers are significantly swollen or turn white or blue. MAKE SURE YOU:   Understand these instructions.  Will watch your condition.  Will get help right away if you are not doing well or get worse.   This information is not intended to replace advice given to you by your health care provider. Make sure you discuss any questions you have with your  health care provider.   Document Released: 07/29/2005 Document Revised: 11/09/2014 Document Reviewed: 02/11/2015 Elsevier Interactive Patient Education 2016 Elsevier Inc.   [employer] Therapy Heat therapy can help ease sore, stiff, injured, and tight muscles and joints. Heat relaxes your muscles, which may help ease your pain.  RISKS AND COMPLICATIONS If you have any of the following conditions, do not use heat therapy unless your health care provider has approved:  Poor circulation.  Healing wounds or scarred skin in the area  being treated.  Diabetes, heart disease, or high blood pressure.  Not being able to feel (numbness) the area being treated.  Unusual swelling of the area being treated.  Active infections.  Blood clots.  Cancer.  Inability to communicate pain. This may include young children and people who have problems with their brain function (dementia).  Pregnancy. Heat therapy should only be used on old, pre-existing, or long-lasting (chronic) injuries. Do not use heat therapy on new injuries unless directed by your health care provider. HOW TO USE HEAT THERAPY There are several different kinds of heat therapy, including:  Moist heat pack.  Warm water bath.  Hot water bottle.  Electric heating pad.  Heated gel pack.  Heated wrap.  Electric heating pad. Use the heat therapy method suggested by your health care provider. Follow your health care provider's instructions on when and how to use heat therapy. GENERAL HEAT THERAPY RECOMMENDATIONS  Do not sleep while using heat therapy. Only use heat therapy while you are awake.  Your skin may turn pink while using heat therapy. Do not use heat therapy if your skin turns red.  Do not use heat therapy if you have new pain.  High heat or long exposure to heat can cause burns. Be careful when using heat therapy to avoid burning your skin.  Do not use heat therapy on areas of your skin that are already irritated, such as with a rash or sunburn. SEEK MEDICAL CARE IF:  You have blisters, redness, swelling, or numbness.  You have new pain.  Your pain is worse. MAKE SURE YOU:  Understand these instructions.  Will watch your condition.  Will get help right away if you are not doing well or get worse.   This information is not intended to replace advice given to you by your health care provider. Make sure you discuss any questions you have with your health care provider.   Document Released: 01/11/2012 Document Revised: 11/09/2014  Document Reviewed: 12/12/2013 Elsevier Interactive Patient Education 2016 [employer].    Smoking Hazards Smoking cigarettes is extremely bad for your health. Tobacco smoke has over 200 known poisons in it. It contains the poisonous gases nitrogen oxide and carbon monoxide. There are over 60 chemicals in tobacco smoke that cause cancer. Some of the chemicals found in cigarette smoke include:   Cyanide.   Benzene.   Formaldehyde.   Methanol (wood alcohol).   Acetylene (fuel used in welding torches).   Ammonia.  Even smoking lightly shortens your life expectancy by several years. You can greatly reduce the risk of medical problems for you and your family by stopping now. Smoking is the most preventable cause of death and disease in our society. Within days of quitting smoking, your circulation improves, you decrease the risk of having a heart attack, and your lung capacity improves. There may be some increased phlegm in the first few days after quitting, and it may take months for your lungs to clear up completely. Quitting for 10  years reduces your risk of developing lung cancer to almost that of a nonsmoker.  WHAT ARE THE RISKS OF SMOKING? Cigarette smokers have an increased risk of many serious medical problems, including:  Lung cancer.   Lung disease (such as pneumonia, bronchitis, and emphysema).   Heart attack and chest pain due to the heart not getting enough oxygen (angina).   Heart disease and peripheral blood vessel disease.   Hypertension.   Stroke.   Oral cancer (cancer of the lip, mouth, or voice box).   Bladder cancer.   Pancreatic cancer.   Cervical cancer.   Pregnancy complications, including premature birth.   Stillbirths and smaller newborn babies, birth defects, and genetic damage to sperm.   Early menopause.   Lower estrogen level for women.   Infertility.   Facial wrinkles.   Blindness.   Increased risk of broken  bones (fractures).   Senile dementia.   Stomach ulcers and internal bleeding.   Delayed wound healing and increased risk of complications during surgery. Because of secondhand smoke exposure, children of smokers have an increased risk of the following:   Sudden infant death syndrome (SIDS).   Respiratory infections.   Lung cancer.   Heart disease.   Ear infections.  WHY IS SMOKING ADDICTIVE? Nicotine is the chemical agent in tobacco that is capable of causing addiction or dependence. When you smoke and inhale, nicotine is absorbed rapidly into the bloodstream through your lungs. Both inhaled and noninhaled nicotine may be addictive.  WHAT ARE THE BENEFITS OF QUITTING?  There are many health benefits to quitting smoking. Some are:   The likelihood of developing cancer and heart disease decreases. Health improvements are seen almost immediately.   Blood pressure, pulse rate, and breathing patterns start returning to normal soon after quitting.   People who quit may see an improvement in their overall quality of life.  HOW DO YOU QUIT SMOKING? Smoking is an addiction with both physical and psychological effects, and longtime habits can be hard to change. Your health care provider can recommend:  Programs and community resources, which may include group support, education, or therapy.  Replacement products, such as patches, gum, and nasal sprays. Use these products only as directed. Do not replace cigarette smoking with electronic cigarettes (commonly called e-cigarettes). The safety of e-cigarettes is unknown, and some may contain harmful chemicals. FOR MORE INFORMATION  American Lung Association: www.lung.org  American Cancer Society: www.cancer.org   This information is not intended to replace advice given to you by your health care provider. Make sure you discuss any questions you have with your health care provider.   Document Released: 11/26/2004 Document  Revised: 08/09/2013 Document Reviewed: 04/10/2013 Elsevier Interactive Patient Education [employer]

## 2015-11-22 NOTE — ED Notes (Signed)
Pt reports left arm pain since October. Had shot in December. States the pain has been increasing. Denies any new activity or injury.

## 2015-11-22 NOTE — ED Notes (Signed)
Pt alert & oriented x4, stable gait. Patient given discharge instructions, paperwork & prescription(s). Patient informed not to drive, operate any equipment & handel any important documents 4 hours after taking pain medication. Patient  instructed to stop at the registration desk to finish any additional paperwork. Patient  verbalized understanding. Pt left department w/ no further questions. 

## 2015-11-25 ENCOUNTER — Other Ambulatory Visit: Payer: Self-pay | Admitting: Neurology

## 2015-11-25 DIAGNOSIS — M542 Cervicalgia: Secondary | ICD-10-CM

## 2015-12-10 ENCOUNTER — Ambulatory Visit (HOSPITAL_COMMUNITY)
Admission: RE | Admit: 2015-12-10 | Discharge: 2015-12-10 | Disposition: A | Discharge status: Home / Self Care | Payer: Medicaid Other | Source: Ambulatory Visit | Attending: Neurology | Admitting: Neurology

## 2015-12-10 DIAGNOSIS — M542 Cervicalgia: Secondary | ICD-10-CM

## 2015-12-20 ENCOUNTER — Ambulatory Visit
Admission: RE | Admit: 2015-12-20 | Discharge: 2015-12-20 | Disposition: A | Discharge status: Home / Self Care | Payer: Medicaid Other | Source: Ambulatory Visit | Attending: Neurology | Admitting: Neurology

## 2016-04-16 ENCOUNTER — Encounter (HOSPITAL_COMMUNITY): Payer: Self-pay | Admitting: Emergency Medicine

## 2016-04-16 ENCOUNTER — Emergency Department (HOSPITAL_COMMUNITY): Payer: Medicaid Other

## 2016-04-16 ENCOUNTER — Emergency Department (HOSPITAL_COMMUNITY)
Admission: EM | Admit: 2016-04-16 | Discharge: 2016-04-17 | Disposition: A | Discharge status: Home / Self Care | Payer: Medicaid Other | Attending: Emergency Medicine | Admitting: Emergency Medicine

## 2016-04-16 DIAGNOSIS — F329 Major depressive disorder, single episode, unspecified: Secondary | ICD-10-CM | POA: Insufficient documentation

## 2016-04-16 DIAGNOSIS — F17210 Nicotine dependence, cigarettes, uncomplicated: Secondary | ICD-10-CM | POA: Diagnosis not present

## 2016-04-16 DIAGNOSIS — R1010 Upper abdominal pain, unspecified: Secondary | ICD-10-CM | POA: Diagnosis not present

## 2016-04-16 DIAGNOSIS — Z79899 Other long term (current) drug therapy: Secondary | ICD-10-CM | POA: Insufficient documentation

## 2016-04-16 LAB — COMPREHENSIVE METABOLIC PANEL
ALT: 13 U/L — AB (ref 14–54)
AST: 20 U/L (ref 15–41)
Albumin: 3.7 g/dL (ref 3.5–5.0)
Alkaline Phosphatase: 72 U/L (ref 38–126)
Anion gap: 8 (ref 5–15)
BILIRUBIN TOTAL: 0.4 mg/dL (ref 0.3–1.2)
BUN: 8 mg/dL (ref 6–20)
CALCIUM: 8.3 mg/dL — AB (ref 8.9–10.3)
CO2: 21 mmol/L — ABNORMAL LOW (ref 22–32)
CREATININE: 0.8 mg/dL (ref 0.44–1.00)
Chloride: 104 mmol/L (ref 101–111)
Glucose, Bld: 137 mg/dL — ABNORMAL HIGH (ref 65–99)
Potassium: 3 mmol/L — ABNORMAL LOW (ref 3.5–5.1)
Sodium: 133 mmol/L — ABNORMAL LOW (ref 135–145)
TOTAL PROTEIN: 6.3 g/dL — AB (ref 6.5–8.1)

## 2016-04-16 LAB — CBC
HEMATOCRIT: 40.4 % (ref 36.0–46.0)
HEMOGLOBIN: 14.4 g/dL (ref 12.0–15.0)
MCH: 32.9 pg (ref 26.0–34.0)
MCHC: 35.6 g/dL (ref 30.0–36.0)
MCV: 92.2 fL (ref 78.0–100.0)
Platelets: 210 10*3/uL (ref 150–400)
RBC: 4.38 MIL/uL (ref 3.87–5.11)
RDW: 12.3 % (ref 11.5–15.5)
WBC: 7.3 10*3/uL (ref 4.0–10.5)

## 2016-04-16 LAB — LIPASE, BLOOD: Lipase: 23 U/L (ref 11–51)

## 2016-04-16 MED ORDER — SODIUM CHLORIDE 0.9 % IV BOLUS (SEPSIS)
1000.0000 mL | Freq: Once | INTRAVENOUS | Status: AC
  Administered 2016-04-16: 1000 mL via INTRAVENOUS
  Filled 2016-04-16: qty 1000

## 2016-04-16 MED ORDER — MORPHINE SULFATE (PF) 4 MG/ML IV SOLN
4.0000 mg | Freq: Once | INTRAVENOUS | Status: AC
  Administered 2016-04-16: 4 mg via INTRAVENOUS
  Filled 2016-04-16: qty 1

## 2016-04-16 MED ORDER — IOPAMIDOL (ISOVUE-300) INJECTION 61%
100.0000 mL | Freq: Once | INTRAVENOUS | Status: AC | PRN
  Administered 2016-04-17: 100 mL via INTRAVENOUS

## 2016-04-16 MED ORDER — ONDANSETRON HCL 4 MG/2ML IJ SOLN
4.0000 mg | Freq: Once | INTRAMUSCULAR | Status: AC
  Administered 2016-04-16: 4 mg via INTRAVENOUS
  Filled 2016-04-16: qty 2

## 2016-04-16 MED ORDER — DIATRIZOATE MEGLUMINE & SODIUM 66-10 % PO SOLN
ORAL | Status: AC
  Administered 2016-04-17: 30 mL
  Filled 2016-04-16: qty 30

## 2016-04-16 MED ORDER — POTASSIUM CHLORIDE CRYS ER 20 MEQ PO TBCR
40.0000 meq | EXTENDED_RELEASE_TABLET | Freq: Once | ORAL | Status: AC
  Administered 2016-04-16: 40 meq via ORAL
  Filled 2016-04-16: qty 2

## 2016-04-16 NOTE — ED Notes (Addendum)
Pt c/o rt upper abd pain. Pt scheduled for hida scan on Monday.

## 2016-04-16 NOTE — ED Notes (Signed)
Pt called to room and no answer 
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
## 2016-04-17 LAB — URINALYSIS, ROUTINE W REFLEX MICROSCOPIC
Bilirubin Urine: NEGATIVE
Glucose, UA: NEGATIVE mg/dL
Ketones, ur: NEGATIVE mg/dL
Leukocytes, UA: NEGATIVE
Nitrite: NEGATIVE
Protein, ur: NEGATIVE mg/dL
Specific Gravity, Urine: <1.005 — ABNORMAL LOW (ref 1.005–1.030)
pH: 6 (ref 5.0–8.0)

## 2016-04-17 LAB — PREGNANCY, URINE: Preg Test, Ur: NEGATIVE

## 2016-04-17 LAB — URINE MICROSCOPIC-ADD ON

## 2016-04-17 MED ORDER — SUCRALFATE 1 GM/10ML PO SUSP: 1.0000 g | Freq: Three times a day (TID) | ORAL | Status: DC

## 2016-04-17 MED ORDER — ONDANSETRON 4 MG PO TBDP: 4.0000 mg | ORAL_TABLET | Freq: Three times a day (TID) | ORAL | Status: DC | PRN

## 2016-04-17 MED ORDER — PANTOPRAZOLE SODIUM 40 MG PO TBEC: 40.0000 mg | DELAYED_RELEASE_TABLET | Freq: Every day | ORAL | Status: DC

## 2016-04-17 MED ORDER — PANTOPRAZOLE SODIUM 40 MG PO TBEC
40.0000 mg | DELAYED_RELEASE_TABLET | Freq: Once | ORAL | Status: AC
  Administered 2016-04-17: 40 mg via ORAL
  Filled 2016-04-17: qty 1

## 2016-04-17 NOTE — Discharge Instructions (Signed)
Please keep your HIDA scan appt on Monday.  We are discharging you with medications to help with your abdominal pain. It appears that you do receive regular prescriptions of Vicodin and tramadol from other physicians. We cannot prescribe any further narcotics from the emergency department. Please follow-up with your primary care physician for further management. We have also given you gastroenterology follow-up information.  Your labs, urine and CT scan were normal today.  Abdominal Pain, Adult Many things can cause abdominal pain. Usually, abdominal pain is not caused by a disease and will improve without treatment. It can often be observed and treated at home. Your health care provider will do a physical exam and possibly order blood tests and X-rays to help determine the seriousness of your pain. However, in many cases, more time must pass before a clear cause of the pain can be found. Before that point, your health care provider may not know if you need more testing or further treatment. HOME CARE INSTRUCTIONS Monitor your abdominal pain for any changes. The following actions may help to alleviate any discomfort you are experiencing:  Only take over-the-counter or prescription medicines as directed by your health care provider.  Do not take laxatives unless directed to do so by your health care provider.  Try a clear liquid diet (broth, tea, or water) as directed by your health care provider. Slowly move to a bland diet as tolerated. SEEK MEDICAL CARE IF:  You have unexplained abdominal pain.  You have abdominal pain associated with nausea or diarrhea.  You have pain when you urinate or have a bowel movement.  You experience abdominal pain that wakes you in the night.  You have abdominal pain that is worsened or improved by eating food.  You have abdominal pain that is worsened with eating fatty foods.  You have a fever. SEEK IMMEDIATE MEDICAL CARE IF:  Your pain does not go away  within 2 hours.  You keep throwing up (vomiting).  Your pain is felt only in portions of the abdomen, such as the right side or the left lower portion of the abdomen.  You pass bloody or black tarry stools. MAKE SURE YOU:  Understand these instructions.  Will watch your condition.  Will get help right away if you are not doing well or get worse.   This information is not intended to replace advice given to you by your health care provider. Make sure you discuss any questions you have with your health care provider.   Document Released: 07/29/2005 Document Revised: 07/10/2015 Document Reviewed: 06/28/2013 Elsevier Interactive Patient Education [employer] Elsevier Inc.

## 2016-04-27 ENCOUNTER — Other Ambulatory Visit (HOSPITAL_COMMUNITY): Payer: Self-pay | Admitting: Neurology

## 2016-04-27 ENCOUNTER — Ambulatory Visit (HOSPITAL_COMMUNITY)
Admission: RE | Admit: 2016-04-27 | Discharge: 2016-04-27 | Disposition: A | Discharge status: Home / Self Care | Payer: Medicaid Other | Source: Ambulatory Visit | Attending: Neurology | Admitting: Neurology

## 2016-04-27 DIAGNOSIS — X58XXXA Exposure to other specified factors, initial encounter: Secondary | ICD-10-CM | POA: Insufficient documentation

## 2016-04-27 DIAGNOSIS — S6992XA Unspecified injury of left wrist, hand and finger(s), initial encounter: Secondary | ICD-10-CM | POA: Diagnosis present

## 2016-04-27 DIAGNOSIS — T1490XA Injury, unspecified, initial encounter: Secondary | ICD-10-CM

## 2016-06-08 ENCOUNTER — Ambulatory Visit (INDEPENDENT_AMBULATORY_CARE_PROVIDER_SITE_OTHER): Payer: Medicaid Other | Admitting: Orthopedic Surgery

## 2016-06-08 ENCOUNTER — Encounter: Payer: Self-pay | Admitting: Orthopedic Surgery

## 2016-06-08 VITALS — BP 107/70 | Ht 61.0 in | Wt 164.0 lb

## 2016-06-08 DIAGNOSIS — G5602 Carpal tunnel syndrome, left upper limb: Secondary | ICD-10-CM | POA: Diagnosis not present

## 2016-06-08 NOTE — Patient Instructions (Addendum)
Carpal Tunnel Release  Carpal tunnel release is a surgical procedure to relieve numbness and pain in your hand that are caused by carpal tunnel syndrome. Your carpal tunnel is a narrow, hollow space in your wrist. It passes between your wrist bones and a band of connective tissue (transverse carpal ligament). The nerve that supplies most of your hand (median nerve) passes through this space, and so do the connections between your fingers and the muscles of your arm (tendons). Carpal tunnel syndrome makes this space swell and become narrow, and this causes pain and numbness.  In carpal tunnel release surgery, a surgeon cuts through the transverse carpal ligament to make more room in the carpal tunnel space. You may have this surgery if other types of treatment have not worked.  LET YOUR HEALTH CARE PROVIDER KNOW ABOUT:  · Any allergies you have.  · All medicines you are taking, including vitamins, herbs, eye drops, creams, and over-the-counter medicines.  · Previous problems you or members of your family have had with the use of anesthetics.  · Any blood disorders you have.  · Previous surgeries you have had.  · Medical conditions you have.  RISKS AND COMPLICATIONS  Generally, this is a safe procedure. However, problems may occur, including:  · Bleeding.  · Infection.  · Injury to the median nerve.  · Need for additional surgery.  BEFORE THE PROCEDURE  · Ask your health care provider about:    Changing or stopping your regular medicines. This is especially important if you are taking diabetes medicines or blood thinners.    Taking medicines such as aspirin and ibuprofen. These medicines can thin your blood. Do not take these medicines before your procedure if your health care provider instructs you not to.  · Do not eat or drink anything after midnight on the night before the procedure or as directed by your health care provider.  · Plan to have someone take you home after the procedure.  PROCEDURE  · An IV tube may  be inserted into a vein.  · You will be given one of the following:    A medicine that numbs the wrist area (local anesthetic). You may also be given a medicine to make you relax (sedative).    A medicine that makes you go to sleep (general anesthetic).  · Your arm, hand, and wrist will be cleaned with a germ-killing solution (antiseptic).  · Your surgeon will make a surgical cut (incision) over the palm side of your wrist. The surgeon will pull aside the skin of your wrist to expose the carpal tunnel space.  · The surgeon will cut the transverse carpal ligament.  · The edges of the incision will be closed with stitches (sutures) or staples.  · A bandage (dressing) will be placed over your wrist and wrapped around your hand and wrist.  AFTER THE PROCEDURE  · You may spend some time in a recovery area.  · Your blood pressure, heart rate, breathing rate, and blood oxygen level will be monitored often until the medicines you were given have worn off.  · You will likely have some pain. You will be given pain medicine.  · You may need to wear a splint or a wrist brace over your dressing.     This information is not intended to replace advice given to you by your health care provider. Make sure you discuss any questions you have with your health care provider.     Document Released:   01/09/2004 Document Revised: 11/09/2014 Document Reviewed: 06/06/2014  Elsevier Interactive Patient Education ©2016 Elsevier Inc.

## 2016-06-08 NOTE — Progress Notes (Signed)
Chief Complaint  Patient presents with  . Carpal Tunnel    bilateral, Lt>Rt   HPI  35 year old female presents for evaluation of left and right carpal tunnel left worse than right. Symptoms present 10 years. Complains of constant numbness tingling with intermittent swelling left and right hand and wrist with specific pain noted in the distal forearm. Symptoms are unrelieved by ibuprofen tramadol wrist splinting and left carpal tunnel injection. She is presenting for possible release of the carpal tunnel  Review of Systems  Constitutional: Negative for fever.  HENT: Positive for congestion, hearing loss and tinnitus.   Gastrointestinal: Positive for abdominal pain and nausea.  Musculoskeletal: Positive for back pain.  Neurological: Positive for tingling and sensory change.  Psychiatric/Behavioral: Positive for depression. The patient is nervous/anxious.     Past Medical History:  Diagnosis Date  . Anxiety   . Chronic chest wall pain   . Chronic left shoulder pain   . Chronic neck pain   . Depressed   . Murmur   . Panic   . Suicide attempt (HCC)    1.attempted overdose on narcotics, 2. attempted to cut self w/ rock 3. attempted to drive car off road.     Past Surgical History:  Procedure Laterality Date  . APPENDECTOMY    . fx tail bone     Family History  Problem Relation Age of Onset  . Cancer Other   . Diabetes Other    Social History  Substance Use Topics  . Smoking status: Current Some Day Smoker    Packs/day: 1.00    Years: 15.00    Types: Cigarettes  . Smokeless tobacco: Never Used  . Alcohol use No    Current Outpatient Prescriptions:  .  benazepril-hydrochlorthiazide (LOTENSIN HCT) 10-12.5 MG tablet, Take 1 tablet by mouth daily., Disp: , Rfl:  .  citalopram (CELEXA) 20 MG tablet, Take 20 mg by mouth daily., Disp: , Rfl:  .  etonogestrel (IMPLANON) 68 MG IMPL implant, Inject 1 each into the skin once., Disp: , Rfl:  .  HYDROcodone-acetaminophen  (NORCO/VICODIN) 5-325 MG tablet, Take 1 tablet by mouth 2 (two) times daily as needed., Disp: , Rfl: 0 .  methocarbamol (ROBAXIN) 500 MG tablet, Take 500 mg by mouth every 6 (six) hours as needed for muscle spasms., Disp: , Rfl:  .  ondansetron (ZOFRAN ODT) 4 MG disintegrating tablet, Take 1 tablet (4 mg total) by mouth every 8 (eight) hours as needed for nausea or vomiting., Disp: 20 tablet, Rfl: 0 .  pantoprazole (PROTONIX) 40 MG tablet, Take 1 tablet (40 mg total) by mouth daily., Disp: 30 tablet, Rfl: 1 .  sucralfate (CARAFATE) 1 GM/10ML suspension, Take 10 mLs (1 g total) by mouth 4 (four) times daily -  with meals and at bedtime., Disp: 420 mL, Rfl: 0 .  traMADol (ULTRAM) 50 MG tablet, Take 1-2 tablets by mouth every 6 (six) hours as needed for moderate pain. , Disp: , Rfl: 1  BP 107/70   Ht 5\' 1"  (1.549 m)   Wt 164 lb (74.4 kg)   BMI 30.99 kg/m   Physical Exam  Constitutional: She is oriented to person, place, and time. She appears well-developed and well-nourished. No distress.  Cardiovascular: Normal rate and intact distal pulses.   Neurological: She is alert and oriented to person, place, and time. She has normal reflexes. She exhibits normal muscle tone. Coordination normal.  Skin: Skin is warm and dry. No rash noted. She is not diaphoretic. No erythema.  No pallor.  Psychiatric: She has a normal mood and affect. Her behavior is normal. Judgment and thought content normal.    Ortho Exam Left and right wrist apparently involved. She has tenderness over the carpal tunnel both wrist as well as over the distal forearm. Her range of motion is limited her grip strength is poor bilaterally although wrist are [facility]. Grip strength is diminished. Skin is intact without nodularity. Pulse and temperature normal radial and ulnar artery and each wrist and she has decreased pinprick sensation in the median nerve distribution of both hands. No lymphadenopathy in either upper  extremity  ASSESSMENT: Nerve conduction study read as  Diagnosis bilateral carpal tunnel syndrome    PLAN Surgical release left carpal tunnel  This procedure has been fully reviewed with the patient and written informed consent has been obtained.   Fuller Canada, MD 06/08/2016 2:11 PM

## 2016-06-10 ENCOUNTER — Other Ambulatory Visit: Payer: Self-pay | Admitting: *Deleted

## 2016-06-16 NOTE — Patient Instructions (Signed)
Helyn Appngela B Bosshart  06/16/2016     @PREFPERIOPPHARMACY @   Your procedure is scheduled on  06/19/2016   [facility]  [facility]  [facility].M.  Call this number if you have problems the morning of surgery:  [phone]   Remember:  Do not eat food or drink liquids after midnight.  Take these medicines the morning of surgery with A SIP OF WATER lotensin, celexa, hydrocodone, robaxin, zofran, protonix, ultram.   Do not wear jewelry, make-up or nail polish.  Do not wear lotions, powders, or perfumes.  You may wear deoderant.  Do not shave 48 hours prior to surgery.  Men may shave face and neck.  Do not bring valuables to the hospital.  [facility] is not responsible for any belongings or valuables.  Contacts, dentures or bridgework may not be worn into surgery.  Leave your suitcase in the car.  After surgery it may be brought to your room.  For patients admitted to the hospital, discharge time will be determined by your treatment team.  Patients discharged the day of surgery will not be allowed to drive home.   Name and phone number of your driver:   family Special instructions:  none  Please read over the following fact sheets that you were given. Anesthesia Post-op Instructions and Care and Recovery After Surgery       Carpal Tunnel Release Carpal tunnel release is a surgical procedure to relieve numbness and pain in your hand that are caused by carpal tunnel syndrome. Your carpal tunnel is a narrow, hollow space in your wrist. It passes between your wrist bones and a band of connective tissue (transverse carpal ligament). The nerve that supplies most of your hand (median nerve) passes through this space, and so do the connections between your fingers and the muscles of your arm (tendons). Carpal tunnel syndrome makes this space swell and become narrow, and this causes pain and numbness. In carpal tunnel release surgery, a surgeon cuts through the transverse carpal  ligament to make more room in the carpal tunnel space. You may have this surgery if other types of treatment have not worked. LET [facility] KNOW ABOUT:  Any allergies you have.  All medicines you are taking, including vitamins, herbs, eye drops, creams, and over-the-counter medicines.  Previous problems you or members of your family have had with the use of anesthetics.  Any blood disorders you have.  Previous surgeries you have had.  Medical conditions you have. RISKS AND COMPLICATIONS Generally, this is a safe procedure. However, problems may occur, including:  Bleeding.  Infection.  Injury to the median nerve.  Need for additional surgery. BEFORE THE PROCEDURE  Ask your health care provider about:  Changing or stopping your regular medicines. This is especially important if you are taking diabetes medicines or blood thinners.  Taking medicines such as aspirin and ibuprofen. These medicines can thin your blood. Do not take these medicines before your procedure if your health care provider instructs you not to.  Do not eat or drink anything after midnight on the night before the procedure or as directed by your health care provider.  Plan to have someone take you home after the procedure. PROCEDURE  An IV tube may be inserted into a vein.  You will be given one of the following:  A medicine that numbs the wrist area (local anesthetic). You may also be given a medicine to  make you relax (sedative).  A medicine that makes you go to sleep (general anesthetic).  Your arm, hand, and wrist will be cleaned with a germ-killing solution (antiseptic).  Your surgeon will make a surgical cut (incision) over the palm side of your wrist. The surgeon will pull aside the skin of your wrist to expose the carpal tunnel space.  The surgeon will cut the transverse carpal ligament.  The edges of the incision will be closed with stitches (sutures) or staples.  A  bandage (dressing) will be placed over your wrist and wrapped around your hand and wrist. AFTER THE PROCEDURE  You may spend some time in a recovery area.  Your blood pressure, heart rate, breathing rate, and blood oxygen level will be monitored often until the medicines you were given have worn off.  You will likely have some pain. You will be given pain medicine.  You may need to wear a splint or a wrist brace over your dressing.   This information is not intended to replace advice given to you by your health care provider. Make sure you discuss any questions you have with your health care provider.   Document Released: 01/09/2004 Document Revised: 11/09/2014 Document Reviewed: 06/06/2014 Elsevier Interactive Patient Education 2016 Elsevier Inc. Carpal Tunnel Release, Care After Refer to this sheet in the next few weeks. These instructions provide you with information about caring for yourself after your procedure. Your health care provider may also give you more specific instructions. Your treatment has been planned according to current medical practices, but problems sometimes occur. Call your health care provider if you have any problems or questions after your procedure. WHAT TO EXPECT AFTER THE PROCEDURE After your procedure, it is typical to have the following:  Pain.  Numbness.  Tingling.  Swelling.  Stiffness.  Bruising. HOME CARE INSTRUCTIONS  Take medicines only as directed by your health care provider.  There are many different ways to close and cover an incision, including stitches (sutures), skin glue, and adhesive strips. Follow your health care provider's instructions about:  Incision care.  Bandage (dressing) changes and removal.  Incision closure removal.  Wear a splint or a brace as directed by your surgeon. You may need to do this for 2-3 weeks.  Keep your hand raised (elevated) above the level of your heart while you are resting. Move your fingers  often.  Avoid activities that cause hand pain.  Ask your surgeon when you can start to do all of your usual activities again, such as:  Driving.  Returning to work.  Bathing and swimming.  Keep all follow-up visits as directed by your health care provider. This is important. You may need physical therapy for several months to speed healing and regain movement. SEEK MEDICAL CARE IF:  You have drainage, redness, swelling, or pain at your incision site.  You have a fever.  You have chills.  Your pain medicine is not working.  Your symptoms do not go away after 2 months.  Your symptoms go away and then return. SEEK IMMEDIATE MEDICAL CARE IF:  You have pain or numbness that is getting worse.  Your fingers change color.  You are not able to move your fingers.   This information is not intended to replace advice given to you by your health care provider. Make sure you discuss any questions you have with your health care provider.   Document Released: 05/08/2005 Document Revised: 11/09/2014 Document Reviewed: 06/06/2014 Elsevier Interactive Patient Education [employer]2016 Else[employer] Inc[employer]  Monitored Anesthesia Care Monitored anesthesia care is an anesthesia service for a medical procedure. Anesthesia is the loss of the ability to feel pain. It is produced by medicines called anesthetics. It may affect a small area of your body (local anesthesia), a large area of your body (regional anesthesia), or your entire body (general anesthesia). The need for monitored anesthesia care depends your procedure, your condition, and the potential need for regional or general anesthesia. It is often provided during procedures where:   General anesthesia may be needed if there are complications. This is because you need special care when you are under general anesthesia.   You will be under local or regional anesthesia. This is so that you are able to have higher levels of anesthesia if needed.   You will  receive calming medicines (sedatives). This is especially the case if sedatives are given to put you in a semi-conscious state of relaxation (deep sedation). This is because the amount of sedative needed to produce this state can be hard to predict. Too much of a sedative can produce general anesthesia. Monitored anesthesia care is performed by one or more health care providers who have special training in all types of anesthesia. You will need to meet with these health care providers before your procedure. During this meeting, they will ask you about your medical history. They will also give you instructions to follow. (For example, you will need to stop eating and drinking before your procedure. You may also need to stop or change medicines you are taking.) During your procedure, your health care providers will stay with you. They will:   Watch your condition. This includes watching your blood pressure, breathing, and level of pain.   Diagnose and treat problems that occur.   Give medicines if they are needed. These may include calming medicines (sedatives) and anesthetics.   Make sure you are comfortable.  Having monitored anesthesia care does not necessarily mean that you will be under anesthesia. It does mean that your health care providers will be able to manage anesthesia if you need it or if it occurs. It also means that you will be able to have a different type of anesthesia than you are having if you need it. When your procedure is complete, your health care providers will continue to watch your condition. They will make sure any medicines wear off before you are allowed to go home.    This information is not intended to replace advice given to you by your health care provider. Make sure you discuss any questions you have with your health care provider.   Document Released: 07/15/2005 Document Revised: 11/09/2014 Document Reviewed: 11/30/2012 Elsevier Interactive Patient Education 2016  Elsevier Inc. PATIENT INSTRUCTIONS POST-ANESTHESIA  IMMEDIATELY FOLLOWING SURGERY:  Do not drive or operate machinery for the first twenty four hours after surgery.  Do not make any important decisions for twenty four hours after surgery or while taking narcotic pain medications or sedatives.  If you develop intractable nausea and vomiting or a severe headache please notify your doctor immediately.  FOLLOW-UP:  Please make an appointment with your surgeon as instructed. You do not need to follow up with anesthesia unless specifically instructed to do so.  WOUND CARE INSTRUCTIONS (if applicable):  Keep a dry clean dressing on the anesthesia/puncture wound site if there is drainage.  Once the wound has quit draining you may leave it open to air.  Generally you should leave the bandage intact for twenty four hours  unless there is drainage.  If the epidural site drains for more than 36-48 hours please call the anesthesia department.  QUESTIONS?:  Please feel free to call your physician or the hospital operator if you have any questions, and they will be happy to assist you.

## 2016-06-17 ENCOUNTER — Encounter (HOSPITAL_COMMUNITY)
Admission: RE | Admit: 2016-06-17 | Discharge: 2016-06-17 | Disposition: A | Discharge status: Home / Self Care | Payer: Medicaid Other | Source: Ambulatory Visit | Attending: Orthopedic Surgery | Admitting: Orthopedic Surgery

## 2016-06-17 ENCOUNTER — Encounter (HOSPITAL_COMMUNITY): Payer: Self-pay

## 2016-06-17 DIAGNOSIS — Z01812 Encounter for preprocedural laboratory examination: Secondary | ICD-10-CM | POA: Diagnosis present

## 2016-06-17 HISTORY — DX: Gastro-esophageal reflux disease without esophagitis: K21.9

## 2016-06-17 HISTORY — DX: Anemia, unspecified: D64.9

## 2016-06-17 HISTORY — DX: Personal history of other diseases of the musculoskeletal system and connective tissue: Z87.39

## 2016-06-17 HISTORY — DX: Unspecified osteoarthritis, unspecified site: M19.90

## 2016-06-17 HISTORY — DX: Unspecified asthma, uncomplicated: J45.909

## 2016-06-17 LAB — BASIC METABOLIC PANEL
ANION GAP: 6 (ref 5–15)
BUN: 7 mg/dL (ref 6–20)
CALCIUM: 8.4 mg/dL — AB (ref 8.9–10.3)
CO2: 23 mmol/L (ref 22–32)
CREATININE: 0.72 mg/dL (ref 0.44–1.00)
Chloride: 106 mmol/L (ref 101–111)
GFR calc Af Amer: >60 mL/min (ref >60)
GFR calc non Af Amer: >60 mL/min (ref >60)
GLUCOSE: 142 mg/dL — AB (ref 65–99)
Potassium: 3.5 mmol/L (ref 3.5–5.1)
Sodium: 135 mmol/L (ref 135–145)

## 2016-06-17 LAB — CBC WITH DIFFERENTIAL/PLATELET
Basophils Absolute: 0 10*3/uL (ref 0.0–0.1)
Basophils Relative: 0 %
Eosinophils Absolute: 0.1 10*3/uL (ref 0.0–0.7)
Eosinophils Relative: 1 %
HEMATOCRIT: 41.5 % (ref 36.0–46.0)
Hemoglobin: 14.2 g/dL (ref 12.0–15.0)
LYMPHS PCT: 31 %
Lymphs Abs: 2 10*3/uL (ref 0.7–4.0)
MCH: 32.4 pg (ref 26.0–34.0)
MCHC: 34.2 g/dL (ref 30.0–36.0)
MCV: 94.7 fL (ref 78.0–100.0)
MONO ABS: 0.4 10*3/uL (ref 0.1–1.0)
Monocytes Relative: 6 %
NEUTROS ABS: 3.9 10*3/uL (ref 1.7–7.7)
Neutrophils Relative %: 62 %
Platelets: 205 10*3/uL (ref 150–400)
RBC: 4.38 MIL/uL (ref 3.87–5.11)
RDW: 12.8 % (ref 11.5–15.5)
WBC: 6.4 10*3/uL (ref 4.0–10.5)

## 2016-06-17 LAB — HCG, SERUM, QUALITATIVE: Preg, Serum: NEGATIVE

## 2016-06-17 NOTE — Pre-Procedure Instructions (Signed)
Patient given information to sign up for my chart at home. 

## 2016-06-18 NOTE — H&P (Signed)
Chief Complaint  Patient presents with  . Carpal Tunnel      bilateral, Lt>Rt    HPI  35 year old 54female presents for evaluation of left and right carpal tunnel left worse than right. Symptoms present 10 years. Complains of constant numbness tingling with intermittent swelling left and right hand and wrist with specific pain noted in the distal forearm. Symptoms are unrelieved by ibuprofen tramadol wrist splinting and left carpal tunnel injection. She is presenting for possible release of the carpal tunnel   Review of Systems  Constitutional: Negative for fever.  HENT: Positive for congestion, hearing loss and tinnitus.   Gastrointestinal: Positive for abdominal pain and nausea.  Musculoskeletal: Positive for back pain.  Neurological: Positive for tingling and sensory change.  Psychiatric/Behavioral: Positive for depression. The patient is nervous/anxious.           Past Medical History:  Diagnosis Date  . Anxiety    . Chronic chest wall pain    . Chronic left shoulder pain    . Chronic neck pain    . Depressed    . Murmur    . Panic    . Suicide attempt (HCC)      1.attempted overdose on narcotics, 2. attempted to cut self w/ rock 3. attempted to drive car off road.            Past Surgical History:  Procedure Laterality Date  . APPENDECTOMY      . fx tail bone             Family History  Problem Relation Age of Onset  . Cancer Other    . Diabetes Other           Social History  Substance Use Topics  . Smoking status: Current Some Day Smoker      Packs/day: 1.00      Years: 15.00      Types: Cigarettes  . Smokeless tobacco: Never Used  . Alcohol use No      Current Outpatient Prescriptions:  .  benazepril-hydrochlorthiazide (LOTENSIN HCT) 10-12.5 MG tablet, Take 1 tablet by mouth daily., Disp: , Rfl:  .  citalopram (CELEXA) 20 MG tablet, Take 20 mg by mouth daily., Disp: , Rfl:  .  etonogestrel (IMPLANON) 68 MG IMPL implant, Inject 1 each into the skin  once., Disp: , Rfl:  .  HYDROcodone-acetaminophen (NORCO/VICODIN) 5-325 MG tablet, Take 1 tablet by mouth 2 (two) times daily as needed., Disp: , Rfl: 0 .  methocarbamol (ROBAXIN) 500 MG tablet, Take 500 mg by mouth every 6 (six) hours as needed for muscle spasms., Disp: , Rfl:  .  ondansetron (ZOFRAN ODT) 4 MG disintegrating tablet, Take 1 tablet (4 mg total) by mouth every 8 (eight) hours as needed for nausea or vomiting., Disp: 20 tablet, Rfl: 0 .  pantoprazole (PROTONIX) 40 MG tablet, Take 1 tablet (40 mg total) by mouth daily., Disp: 30 tablet, Rfl: 1 .  sucralfate (CARAFATE) 1 GM/10ML suspension, Take 10 mLs (1 g total) by mouth 4 (four) times daily -  with meals and at bedtime., Disp: 420 mL, Rfl: 0 .  traMADol (ULTRAM) 50 MG tablet, Take 1-2 tablets by mouth every 6 (six) hours as needed for moderate pain. , Disp: , Rfl: 1   BP 107/70   Ht 5\' 1"  (1.549 m)   Wt 164 lb (74.4 kg)   BMI 30.99 kg/m    Physical Exam  Constitutional: She is oriented to person, place, and time. She  appears well-developed and well-nourished. No distress.  Cardiovascular: Normal rate and intact distal pulses.   Neurological: She is alert and oriented to person, place, and time. She has normal reflexes. She exhibits normal muscle tone. Coordination normal.  Skin: Skin is warm and dry. No rash noted. She is not diaphoretic. No erythema. No pallor.  Psychiatric: She has a normal mood and affect. Her behavior is normal. Judgment and thought content normal.      Ortho Exam Left and right wrist apparently involved. She has tenderness over the carpal tunnel both wrist as well as over the distal forearm. Her range of motion is limited her grip strength is poor bilaterally although wrist are stable to the [facility] test. Grip strength is diminished. Skin is intact without nodularity. Pulse and temperature normal radial and ulnar artery and each wrist and she has decreased pinprick sensation in the median nerve distribution  of both hands. No lymphadenopathy in either upper extremity   ASSESSMENT: Nerve conduction study read as   Diagnosis bilateral carpal tunnel syndrome       PLAN Surgical release left carpal tunnel   This procedure has been fully reviewed with the patient and written informed consent has been obtained.

## 2016-06-19 ENCOUNTER — Ambulatory Visit (HOSPITAL_COMMUNITY)
Admission: RE | Admit: 2016-06-19 | Discharge: 2016-06-19 | Disposition: A | Discharge status: Home / Self Care | Payer: Medicaid Other | Source: Ambulatory Visit | Attending: Orthopedic Surgery | Admitting: Orthopedic Surgery

## 2016-06-19 ENCOUNTER — Ambulatory Visit (HOSPITAL_COMMUNITY): Payer: Medicaid Other | Admitting: Anesthesiology

## 2016-06-19 ENCOUNTER — Encounter (HOSPITAL_COMMUNITY)
Admission: RE | Disposition: A | Discharge status: Home / Self Care | Payer: Self-pay | Source: Ambulatory Visit | Attending: Orthopedic Surgery

## 2016-06-19 DIAGNOSIS — J45909 Unspecified asthma, uncomplicated: Secondary | ICD-10-CM | POA: Insufficient documentation

## 2016-06-19 DIAGNOSIS — Z833 Family history of diabetes mellitus: Secondary | ICD-10-CM | POA: Diagnosis not present

## 2016-06-19 DIAGNOSIS — M25519 Pain in unspecified shoulder: Secondary | ICD-10-CM | POA: Diagnosis not present

## 2016-06-19 DIAGNOSIS — G5602 Carpal tunnel syndrome, left upper limb: Secondary | ICD-10-CM | POA: Diagnosis present

## 2016-06-19 DIAGNOSIS — F419 Anxiety disorder, unspecified: Secondary | ICD-10-CM | POA: Insufficient documentation

## 2016-06-19 DIAGNOSIS — G8929 Other chronic pain: Secondary | ICD-10-CM | POA: Insufficient documentation

## 2016-06-19 DIAGNOSIS — F17210 Nicotine dependence, cigarettes, uncomplicated: Secondary | ICD-10-CM | POA: Insufficient documentation

## 2016-06-19 DIAGNOSIS — M542 Cervicalgia: Secondary | ICD-10-CM | POA: Diagnosis not present

## 2016-06-19 DIAGNOSIS — Z809 Family history of malignant neoplasm, unspecified: Secondary | ICD-10-CM | POA: Insufficient documentation

## 2016-06-19 DIAGNOSIS — R0789 Other chest pain: Secondary | ICD-10-CM | POA: Diagnosis not present

## 2016-06-19 HISTORY — PX: CARPAL TUNNEL RELEASE: SHX101

## 2016-06-19 SURGERY — CARPAL TUNNEL RELEASE
Anesthesia: Regional | Site: Hand | Laterality: Left

## 2016-06-19 MED ORDER — HYDROCODONE-ACETAMINOPHEN 5-325 MG PO TABS: 1.0000 | ORAL_TABLET | ORAL | 0 refills | Status: DC | PRN

## 2016-06-19 MED ORDER — PROPOFOL 500 MG/50ML IV EMUL
INTRAVENOUS | Status: DC | PRN
  Administered 2016-06-19: 75 ug/kg/min via INTRAVENOUS

## 2016-06-19 MED ORDER — CEFAZOLIN SODIUM-DEXTROSE 2-4 GM/100ML-% IV SOLN
2.0000 g | INTRAVENOUS | Status: AC
  Administered 2016-06-19: 2 g via INTRAVENOUS
  Filled 2016-06-19: qty 100

## 2016-06-19 MED ORDER — FENTANYL CITRATE (PF) 100 MCG/2ML IJ SOLN
INTRAMUSCULAR | Status: AC
  Filled 2016-06-19: qty 2

## 2016-06-19 MED ORDER — BUPIVACAINE HCL (PF) 0.5 % IJ SOLN
INTRAMUSCULAR | Status: DC | PRN
  Administered 2016-06-19: 10 mL

## 2016-06-19 MED ORDER — MIDAZOLAM HCL 2 MG/2ML IJ SOLN
INTRAMUSCULAR | Status: AC
  Filled 2016-06-19: qty 2

## 2016-06-19 MED ORDER — CHLORHEXIDINE GLUCONATE 4 % EX LIQD: 60.0000 mL | Freq: Once | CUTANEOUS | Status: DC

## 2016-06-19 MED ORDER — 0.9 % SODIUM CHLORIDE (POUR BTL) OPTIME
TOPICAL | Status: DC | PRN
  Administered 2016-06-19: 1000 mL

## 2016-06-19 MED ORDER — LACTATED RINGERS IV SOLN
INTRAVENOUS | Status: DC
  Administered 2016-06-19: 10:00:00 via INTRAVENOUS

## 2016-06-19 MED ORDER — IBUPROFEN 800 MG PO TABS: 800.0000 mg | ORAL_TABLET | Freq: Three times a day (TID) | ORAL | 1 refills | Status: DC | PRN

## 2016-06-19 MED ORDER — HYDROMORPHONE HCL 1 MG/ML IJ SOLN
0.2500 mg | INTRAMUSCULAR | Status: DC | PRN
  Administered 2016-06-19: 0.5 mg via INTRAVENOUS

## 2016-06-19 MED ORDER — LIDOCAINE HCL (PF) 0.5 % IJ SOLN
INTRAMUSCULAR | Status: AC
  Filled 2016-06-19: qty 50

## 2016-06-19 MED ORDER — MIDAZOLAM HCL 2 MG/2ML IJ SOLN
1.0000 mg | INTRAMUSCULAR | Status: DC | PRN
  Administered 2016-06-19 (×2): 2 mg via INTRAVENOUS
  Filled 2016-06-19: qty 2

## 2016-06-19 MED ORDER — MIDAZOLAM HCL 5 MG/5ML IJ SOLN
INTRAMUSCULAR | Status: DC | PRN
  Administered 2016-06-19: 2 mg via INTRAVENOUS

## 2016-06-19 MED ORDER — BUPIVACAINE HCL (PF) 0.5 % IJ SOLN
INTRAMUSCULAR | Status: AC
Start: 2016-06-19 — End: 2016-06-19
  Filled 2016-06-19: qty 30

## 2016-06-19 MED ORDER — PROPOFOL 10 MG/ML IV BOLUS
INTRAVENOUS | Status: AC
  Filled 2016-06-19: qty 20

## 2016-06-19 MED ORDER — LIDOCAINE HCL (PF) 1 % IJ SOLN
INTRAMUSCULAR | Status: AC
  Filled 2016-06-19: qty 5

## 2016-06-19 MED ORDER — HYDROMORPHONE HCL 1 MG/ML IJ SOLN
INTRAMUSCULAR | Status: AC
  Filled 2016-06-19: qty 1

## 2016-06-19 MED ORDER — LIDOCAINE HCL (PF) 0.5 % IJ SOLN
INTRAMUSCULAR | Status: DC | PRN
  Administered 2016-06-19: 50 mL

## 2016-06-19 MED ORDER — FENTANYL CITRATE (PF) 100 MCG/2ML IJ SOLN
25.0000 ug | INTRAMUSCULAR | Status: AC | PRN
  Administered 2016-06-19 (×2): 25 ug via INTRAVENOUS

## 2016-06-19 SURGICAL SUPPLY — 43 items
BAG HAMPER (MISCELLANEOUS) ×3 IMPLANT
BANDAGE ELASTIC 3 LF NS (GAUZE/BANDAGES/DRESSINGS) ×3 IMPLANT
BANDAGE ESMARK 4X12 BL STRL LF (DISPOSABLE) ×1 IMPLANT
BLADE SURG 15 STRL LF DISP TIS (BLADE) ×1 IMPLANT
BLADE SURG 15 STRL SS (BLADE) ×3
BNDG CMPR 12X4 ELC STRL LF (DISPOSABLE) ×1
BNDG CMPR MED 5X3 ELC HKLP NS (GAUZE/BANDAGES/DRESSINGS) ×1
BNDG COHESIVE 4X5 TAN STRL (GAUZE/BANDAGES/DRESSINGS) ×3 IMPLANT
BNDG ESMARK 4X12 BLUE STRL LF (DISPOSABLE) ×3
BNDG GAUZE ELAST 4 BULKY (GAUZE/BANDAGES/DRESSINGS) ×2 IMPLANT
CHLORAPREP W/TINT 26ML (MISCELLANEOUS) ×3 IMPLANT
CLOTH BEACON ORANGE TIMEOUT ST (SAFETY) ×3 IMPLANT
COVER LIGHT HANDLE STERIS (MISCELLANEOUS) ×6 IMPLANT
CUFF TOURNIQUET SINGLE 18IN (TOURNIQUET CUFF) ×3 IMPLANT
DECANTER SPIKE VIAL GLASS SM (MISCELLANEOUS) ×3 IMPLANT
DRAPE PROXIMA HALF (DRAPES) ×3 IMPLANT
DRSG XEROFORM 1X8 (GAUZE/BANDAGES/DRESSINGS) ×2 IMPLANT
ELECT NDL TIP 2.8 STRL (NEEDLE) ×1 IMPLANT
ELECT NEEDLE TIP 2.8 STRL (NEEDLE) ×3 IMPLANT
ELECT REM PT RETURN 9FT ADLT (ELECTROSURGICAL) ×3
ELECTRODE REM PT RTRN 9FT ADLT (ELECTROSURGICAL) ×1 IMPLANT
GAUZE SPONGE 4X4 12PLY STRL (GAUZE/BANDAGES/DRESSINGS) ×2 IMPLANT
GLOVE BIOGEL PI IND STRL 7.0 (GLOVE) ×1 IMPLANT
GLOVE BIOGEL PI INDICATOR 7.0 (GLOVE) ×2
GLOVE ECLIPSE 6.5 STRL STRAW (GLOVE) ×2 IMPLANT
GLOVE EXAM NITRILE PF MED BLUE (GLOVE) ×2 IMPLANT
GLOVE SKINSENSE NS SZ8.0 LF (GLOVE) ×2
GLOVE SKINSENSE STRL SZ8.0 LF (GLOVE) ×1 IMPLANT
GLOVE SS N UNI LF 8.5 STRL (GLOVE) ×3 IMPLANT
GOWN STRL REUS W/ TWL LRG LVL3 (GOWN DISPOSABLE) ×1 IMPLANT
GOWN STRL REUS W/TWL LRG LVL3 (GOWN DISPOSABLE) ×3
GOWN STRL REUS W/TWL XL LVL3 (GOWN DISPOSABLE) ×3 IMPLANT
HAND ALUMI XLG (SOFTGOODS) ×3 IMPLANT
KIT ROOM TURNOVER APOR (KITS) ×3 IMPLANT
MANIFOLD NEPTUNE II (INSTRUMENTS) ×3 IMPLANT
NDL HYPO 21X1.5 SAFETY (NEEDLE) ×1 IMPLANT
NEEDLE HYPO 21X1.5 SAFETY (NEEDLE) ×3 IMPLANT
NS IRRIG 1000ML POUR BTL (IV SOLUTION) ×3 IMPLANT
PACK BASIC LIMB (CUSTOM PROCEDURE TRAY) ×3 IMPLANT
PAD ARMBOARD 7.5X6 YLW CONV (MISCELLANEOUS) ×3 IMPLANT
SET BASIN LINEN APH (SET/KITS/TRAYS/PACK) ×3 IMPLANT
SUT ETHILON 3 0 FSL (SUTURE) ×3 IMPLANT
SYR CONTROL 10ML LL (SYRINGE) ×3 IMPLANT

## 2016-06-19 NOTE — Anesthesia Postprocedure Evaluation (Signed)
Anesthesia Post Note  Patient: Summer Golden  Procedure(s) Performed: Procedure(s) (LRB): LEFT CARPAL TUNNEL RELEASE (Left)  Patient location during evaluation: PACU Anesthesia Type: Bier Block Level of consciousness: awake and alert and oriented Pain management: pain level controlled Vital Signs Assessment: post-procedure vital signs reviewed and stable Respiratory status: spontaneous breathing and patient connected to nasal cannula oxygen Cardiovascular status: stable Postop Assessment: no signs of nausea or vomiting Anesthetic complications: no    Last Vitals:  Vitals:   06/19/16 0925 06/19/16 0930  BP: 131/73   Resp: (!) 21 17  Temp:      Last Pain:  Vitals:   06/19/16 0843  TempSrc: Oral  PainSc: 3                  Duvan Mousel A

## 2016-06-19 NOTE — Anesthesia Preprocedure Evaluation (Signed)
Anesthesia Evaluation  Patient identified by MRN, date of birth, ID band Patient awake    Reviewed: Allergy & Precautions, NPO status , Patient's Chart, lab work & pertinent test results  Airway Mallampati: II  TM Distance: >3 FB     Dental  (+) Edentulous Upper, Poor Dentition   Pulmonary asthma , Current Smoker,    breath sounds clear to auscultation       Cardiovascular negative cardio ROS   Rhythm:Regular Rate:Normal     Neuro/Psych PSYCHIATRIC DISORDERS ( Suicide attempts) Anxiety Depression    GI/Hepatic GERD  Controlled,  Endo/Other    Renal/GU      Musculoskeletal   Abdominal   Peds  Hematology   Anesthesia Other Findings   Reproductive/Obstetrics                             Anesthesia Physical Anesthesia Plan  ASA: II  Anesthesia Plan: Bier Block   Post-op Pain Management:    Induction: Intravenous  Airway Management Planned: Simple Face Mask  Additional Equipment:   Intra-op Plan:   Post-operative Plan:   Informed Consent: I have reviewed the patients History and Physical, chart, labs and discussed the procedure including the risks, benefits and alternatives for the proposed anesthesia with the patient or authorized representative who has indicated his/her understanding and acceptance.     Plan Discussed with:   Anesthesia Plan Comments:         Anesthesia Quick Evaluation

## 2016-06-19 NOTE — Anesthesia Procedure Notes (Signed)
Procedure Name: MAC Date/Time: 06/19/2016 9:59 AM Performed by: Pernell DupreADAMS, Summer Glantz A Pre-anesthesia Checklist: Patient identified, Timeout performed, Emergency Drugs available and Suction available Patient Re-evaluated:Patient Re-evaluated prior to inductionOxygen Delivery Method: Simple face mask

## 2016-06-19 NOTE — Interval H&P Note (Signed)
History and Physical Interval Note:  06/19/2016 9:55 AM BP 131/73   Temp 98.7 F (37.1 C) (Oral)   Resp 17   LMP 06/12/2016 (Exact Date)   SpO2 100%  Skin clean at surgical site Summer Golden  has presented today for surgery, with the diagnosis of left carpal tunnel syndrome  The various methods of treatment have been discussed with the patient and family. After consideration of risks, benefits and other options for treatment, the patient has consented to  Procedure(s): LEFT CARPAL TUNNEL RELEASE (Left) as a surgical intervention .  The patient's history has been reviewed, patient examined, no change in status, stable for surgery.  I have reviewed the patient's chart and labs.  Questions were answered to the patient's satisfaction.     Fuller CanadaStanley Paulo Keimig

## 2016-06-19 NOTE — Addendum Note (Signed)
Addendum  created 06/19/16 1348 by Franco Noneseresa S Tylyn Derwin, CRNA   Charge Capture section accepted

## 2016-06-19 NOTE — Brief Op Note (Signed)
06/19/2016  10:37 AM  PATIENT:  Summer Golden  35 y.o. female  PRE-OPERATIVE DIAGNOSIS:  left carpal tunnel syndrome  POST-OPERATIVE DIAGNOSIS:  left carpal tunnel syndrome  Surgical findings  Compression of the median nerve tight carpal tunnel slight discoloration of the nerve normal shape of the nerve  There were no assistants  Regional anesthesia with Bier block  No blood administered  No drains  We used 10 mL of plain Marcaine  No specimens  Counts were correct  I will use Dragon Dictation  Disposition to PACU plan of care to go home after observation in the postanesthesia care unit  No DVT prevention needed  Carpal tunnel release left wrist  Preop diagnosis carpal tunnel syndrome left wrist   postop diagnosis same  Procedure open carpal tunnel release left wrist Surgeon Codee Bloodworth  Anesthesia regional Bier block  Operative findings compression of the left median nerve without any anterior carpal tunnel masses   Indications failure of conservative treatment to relieve pain and paresthesias and numbness and tingling of the left hand  The patient was identified in the preop area we confirm the surgical site marked as left wrist. Chart update completed. Patient taken to surgery. She had 2 g of Ancef. After establishing a Bier block left her arm was prepped with ChloraPrep.  Timeout executed completed and confirmed site.  A straight incision was made over the left carpal tunnel in line with the radial border of the ring finger. Blunt dissection was carried out to find the distal aspect of the carpal tunnel. A blunted instrument was passed beneath the carpal tunnel. Sharp incision was then used to release the transverse carpal ligament. The contents of the carpal tunnel were inspected. The median nerve was compressed with slight discoloration.  The wound was irrigated and then closed with 3-0 nylon suture. We injected 10 mL of plain Marcaine on the radial side of  the incision  A sterile bandage was applied and the tourniquet was released the color of the hand and capillary refill were normal  The patient was taken to the recovery room in stable condition  PROCEDURE:  Procedure(s): LEFT CARPAL TUNNEL RELEASE (Left)  SURGEON:  Surgeon(s) and Role:    * Kazoua Gossen E Gerome Kokesh, MD - Primary   EBL:  Total I/O In: 700 [I.V.:700] Out: 0   B TOURNIQUET:   Total Tourniquet Time Documented: Upper Arm (Left) - 29 minutes Total: Upper Arm (Left) - 29 minutes   

## 2016-06-19 NOTE — Op Note (Signed)
06/19/2016  10:37 AM  PATIENT:  Summer Golden  35 y.o. female  PRE-OPERATIVE DIAGNOSIS:  left carpal tunnel syndrome  POST-OPERATIVE DIAGNOSIS:  left carpal tunnel syndrome  Surgical findings  Compression of the median nerve tight carpal tunnel slight discoloration of the nerve normal shape of the nerve  There were no assistants  Regional anesthesia with Bier block  No blood administered  No drains  We used 10 mL of plain Marcaine  No specimens  Counts were correct  I will use Dragon Dictation  Disposition to PACU plan of care to go home after observation in the postanesthesia care unit  No DVT prevention needed  Carpal tunnel release left wrist  Preop diagnosis carpal tunnel syndrome left wrist   postop diagnosis same  Procedure open carpal tunnel release left wrist Surgeon Romeo AppleHarrison  Anesthesia regional Bier block  Operative findings compression of the left median nerve without any anterior carpal tunnel masses   Indications failure of conservative treatment to relieve pain and paresthesias and numbness and tingling of the left hand  The patient was identified in the preop area we confirm the surgical site marked as left wrist. Chart update completed. Patient taken to surgery. She had 2 g of Ancef. After establishing a Bier block left her arm was prepped with ChloraPrep.  Timeout executed completed and confirmed site.  A straight incision was made over the left carpal tunnel in line with the radial border of the ring finger. Blunt dissection was carried out to find the distal aspect of the carpal tunnel. A blunted instrument was passed beneath the carpal tunnel. Sharp incision was then used to release the transverse carpal ligament. The contents of the carpal tunnel were inspected. The median nerve was compressed with slight discoloration.  The wound was irrigated and then closed with 3-0 nylon suture. We injected 10 mL of plain Marcaine on the radial side of  the incision  A sterile bandage was applied and the tourniquet was released the color of the hand and capillary refill were normal  The patient was taken to the recovery room in stable condition  PROCEDURE:  Procedure(s): LEFT CARPAL TUNNEL RELEASE (Left)  SURGEON:  Surgeon(s) and Role:    * Vickki HearingStanley E Harrison, MD - Primary   EBL:  Total I/O In: 700 [I.V.:700] Out: 0   B TOURNIQUET:   Total Tourniquet Time Documented: Upper Arm (Left) - 29 minutes Total: Upper Arm (Left) - 29 minutes

## 2016-06-19 NOTE — Transfer of Care (Signed)
Immediate Anesthesia Transfer of Care Note  Patient: Summer Golden  Procedure(s) Performed: Procedure(s): LEFT CARPAL TUNNEL RELEASE (Left)  Patient Location: PACU  Anesthesia Type:Bier block  Level of Consciousness: awake, alert , oriented and patient cooperative  Airway & Oxygen Therapy: Patient Spontanous Breathing  Post-op Assessment: Report given to RN and Post -op Vital signs reviewed and stable  Post vital signs: Reviewed and stable  Last Vitals:  Vitals:   06/19/16 0925 06/19/16 0930  BP: 131/73   Resp: (!) 21 17  Temp:      Last Pain:  Vitals:   06/19/16 0843  TempSrc: Oral  PainSc: 3       Patients Stated Pain Goal: 6 (06/19/16 0843)  Complications: No apparent anesthesia complications

## 2016-06-19 NOTE — Discharge Instructions (Signed)
Carpal Tunnel Release  Carpal tunnel release is a surgical procedure to relieve numbness and pain in your hand that are caused by carpal tunnel syndrome. Your carpal tunnel is a narrow, hollow space in your wrist. It passes between your wrist bones and a band of connective tissue (transverse carpal ligament). The nerve that supplies most of your hand (median nerve) passes through this space, and so do the connections between your fingers and the muscles of your arm (tendons). Carpal tunnel syndrome makes this space swell and become narrow, and this causes pain and numbness.  In carpal tunnel release surgery, a surgeon cuts through the transverse carpal ligament to make more room in the carpal tunnel space. You may have this surgery if other types of treatment have not worked.  LET YOUR HEALTH CARE PROVIDER KNOW ABOUT:  · Any allergies you have.  · All medicines you are taking, including vitamins, herbs, eye drops, creams, and over-the-counter medicines.  · Previous problems you or members of your family have had with the use of anesthetics.  · Any blood disorders you have.  · Previous surgeries you have had.  · Medical conditions you have.  RISKS AND COMPLICATIONS  Generally, this is a safe procedure. However, problems may occur, including:  · Bleeding.  · Infection.  · Injury to the median nerve.  · Need for additional surgery.  BEFORE THE PROCEDURE  · Ask your health care provider about:    Changing or stopping your regular medicines. This is especially important if you are taking diabetes medicines or blood thinners.    Taking medicines such as aspirin and ibuprofen. These medicines can thin your blood. Do not take these medicines before your procedure if your health care provider instructs you not to.  · Do not eat or drink anything after midnight on the night before the procedure or as directed by your health care provider.  · Plan to have someone take you home after the procedure.  PROCEDURE  · An IV tube may  be inserted into a vein.  · You will be given one of the following:    A medicine that numbs the wrist area (local anesthetic). You may also be given a medicine to make you relax (sedative).    A medicine that makes you go to sleep (general anesthetic).  · Your arm, hand, and wrist will be cleaned with a germ-killing solution (antiseptic).  · Your surgeon will make a surgical cut (incision) over the palm side of your wrist. The surgeon will pull aside the skin of your wrist to expose the carpal tunnel space.  · The surgeon will cut the transverse carpal ligament.  · The edges of the incision will be closed with stitches (sutures) or staples.  · A bandage (dressing) will be placed over your wrist and wrapped around your hand and wrist.  AFTER THE PROCEDURE  · You may spend some time in a recovery area.  · Your blood pressure, heart rate, breathing rate, and blood oxygen level will be monitored often until the medicines you were given have worn off.  · You will likely have some pain. You will be given pain medicine.  · You may need to wear a splint or a wrist brace over your dressing.     This information is not intended to replace advice given to you by your health care provider. Make sure you discuss any questions you have with your health care provider.     Document Released:   01/09/2004 Document Revised: 11/09/2014 Document Reviewed: 06/06/2014  Elsevier Interactive Patient Education ©2016 [employer]

## 2016-06-22 ENCOUNTER — Telehealth: Payer: Self-pay | Admitting: Orthopedic Surgery

## 2016-06-22 NOTE — Telephone Encounter (Signed)
Patient called with question, status/post carpal tunnel surgery 06/18/16 - states placed weight onto it while getting out of bed Saturday night, 06/20/16, and "it feels like I stretched it", and it's hurting.  Due to no providers here in clinic today, scheduled for appointment tomorrow, 06/23/16. (original post op visit was scheduled Friday, 06/26/16)

## 2016-06-23 ENCOUNTER — Encounter: Payer: Self-pay | Admitting: Orthopedic Surgery

## 2016-06-23 ENCOUNTER — Ambulatory Visit (INDEPENDENT_AMBULATORY_CARE_PROVIDER_SITE_OTHER): Payer: Medicaid Other | Admitting: Orthopedic Surgery

## 2016-06-23 VITALS — BP 113/65 | HR 67 | Ht 61.0 in | Wt 164.0 lb

## 2016-06-23 DIAGNOSIS — Z4789 Encounter for other orthopedic aftercare: Secondary | ICD-10-CM

## 2016-06-23 NOTE — Progress Notes (Signed)
Postop visit #1 status post left carpal tunnel release  The patient placed her hand down and pushed on it and thought she split her sutures open but suture line is intact minimal swelling no drainage  Plastic dressing placed over the incision to encase back bandage reapplied  Patient will come in for suture removal and postop day 12-14

## 2016-06-24 ENCOUNTER — Encounter (HOSPITAL_COMMUNITY): Payer: Self-pay | Admitting: Orthopedic Surgery

## 2016-06-25 ENCOUNTER — Other Ambulatory Visit: Payer: Self-pay | Admitting: Orthopedic Surgery

## 2016-06-25 ENCOUNTER — Telehealth: Payer: Self-pay | Admitting: Orthopaedic Surgery

## 2016-06-25 MED ORDER — HYDROCODONE-ACETAMINOPHEN 5-325 MG PO TABS: 1.0000 | ORAL_TABLET | Freq: Four times a day (QID) | ORAL | 0 refills | Status: DC | PRN

## 2016-06-25 NOTE — Telephone Encounter (Signed)
Routing to Dr Harrison for approval 

## 2016-06-25 NOTE — Telephone Encounter (Signed)
Patient called for refill of:  HYDROcodone-acetaminophen (NORCO/VICODIN) 5-325 MG tablet 30 tablet

## 2016-06-26 ENCOUNTER — Ambulatory Visit: Payer: Medicaid Other | Admitting: Orthopedic Surgery

## 2016-07-03 ENCOUNTER — Ambulatory Visit (INDEPENDENT_AMBULATORY_CARE_PROVIDER_SITE_OTHER): Payer: Medicaid Other | Admitting: Orthopedic Surgery

## 2016-07-03 ENCOUNTER — Encounter: Payer: Self-pay | Admitting: Orthopedic Surgery

## 2016-07-03 VITALS — BP 112/71 | HR 81 | Ht 61.0 in | Wt 158.0 lb

## 2016-07-03 DIAGNOSIS — Z4789 Encounter for other orthopedic aftercare: Secondary | ICD-10-CM

## 2016-07-03 DIAGNOSIS — G5602 Carpal tunnel syndrome, left upper limb: Secondary | ICD-10-CM

## 2016-07-03 MED ORDER — HYDROCODONE-ACETAMINOPHEN 5-325 MG PO TABS: 1.0000 | ORAL_TABLET | Freq: Four times a day (QID) | ORAL | 0 refills | Status: DC | PRN

## 2016-07-03 NOTE — Patient Instructions (Signed)
EXERCISE THE HAND DAILY

## 2016-07-03 NOTE — Progress Notes (Signed)
Patient ID: Summer Golden, female   DOB: 09/27/1981, 35 y.o.   MRN: [id number]  Post op visit   Chief Complaint  Patient presents with  . Follow-up    Post op #2, left CTR, suture removal. DOS 06-19-16.    BP 112/71   Pulse 81   Ht 5\' 1"  (1.549 m)   Wt 158 lb (71.7 kg)   LMP 06/12/2016 (Exact Date)   BMI 29.85 kg/m   SUTURES WERE REMOVED   WOUND IS CLEAN   ASSESSMENT AND PLAN   HAND EXERCISES DAILY   FOLLOW UP AS NEEDED   Meds ordered this encounter  Medications  . HYDROcodone-acetaminophen (NORCO/VICODIN) 5-325 MG tablet    Sig: Take 1 tablet by mouth every 6 (six) hours as needed for moderate pain.    Dispense:  28 tablet    Refill:  0

## 2016-07-13 ENCOUNTER — Ambulatory Visit (INDEPENDENT_AMBULATORY_CARE_PROVIDER_SITE_OTHER): Payer: Medicaid Other | Admitting: Orthopedic Surgery

## 2016-07-13 VITALS — BP 103/71 | HR 102 | Ht 62.0 in | Wt 160.0 lb

## 2016-07-13 DIAGNOSIS — Z4789 Encounter for other orthopedic aftercare: Secondary | ICD-10-CM

## 2016-07-13 DIAGNOSIS — G5602 Carpal tunnel syndrome, left upper limb: Secondary | ICD-10-CM

## 2016-07-13 MED ORDER — HYDROCODONE-ACETAMINOPHEN 5-325 MG PO TABS: 1.0000 | ORAL_TABLET | Freq: Four times a day (QID) | ORAL | 0 refills | Status: DC | PRN

## 2016-07-13 NOTE — Progress Notes (Signed)
Chief Complaint  Patient presents with  . Follow-up    Recheck, post op, left wrist, DOS 06-19-16.    BP 103/71   Pulse (!) 102   Ht 5\' 2"  (1.575 m)   Wt 160 lb (72.6 kg)   LMP 06/12/2016 (Exact Date)   BMI 29.26 kg/m   Status post carpal tunnel release left hand on the 18th. 24 days postop. Patient says she lifted a heavy pot now her wrist hurts. Most of her numbness and tingling of gone she has pain in the volar aspect of the wrist radiates to the elbow.  The wound looks clean she has swelling over the incision there is no erythema  Recommend wrist splint ibuprofen and she can take some Norco for the next week and then use ice as needed  Follow-up 4 weeks brace 4 weeks

## 2016-07-13 NOTE — Patient Instructions (Signed)
No heavy lifting  Wear splint 4 weeks  Take ibuprofen with the Norco for the first week Ibuprofen  Follow-up 4 weeks

## 2016-07-20 ENCOUNTER — Other Ambulatory Visit: Payer: Self-pay | Admitting: Orthopedic Surgery

## 2016-07-20 ENCOUNTER — Telehealth: Payer: Self-pay | Admitting: Orthopedic Surgery

## 2016-07-20 DIAGNOSIS — Z4789 Encounter for other orthopedic aftercare: Secondary | ICD-10-CM

## 2016-07-20 DIAGNOSIS — G5602 Carpal tunnel syndrome, left upper limb: Secondary | ICD-10-CM

## 2016-07-20 MED ORDER — HYDROCODONE-ACETAMINOPHEN 5-325 MG PO TABS: 1.0000 | ORAL_TABLET | Freq: Three times a day (TID) | ORAL | 0 refills | Status: DC | PRN

## 2016-07-20 NOTE — Telephone Encounter (Signed)
Hydrocodone-Acetaminophen 5/325 mg  Qty 30 Tablets   ° ° °Take 1 tablet by mouth every 6 (six) hours as needed for moderate pain. °

## 2016-07-20 NOTE — Telephone Encounter (Signed)
I wrote already ???

## 2016-07-20 NOTE — Telephone Encounter (Signed)
ROUTING TO DR HARRISON 

## 2016-07-27 ENCOUNTER — Telehealth: Payer: Self-pay | Admitting: Orthopedic Surgery

## 2016-07-27 NOTE — Telephone Encounter (Signed)
Routing to Dr Harrison for approval 

## 2016-07-27 NOTE — Telephone Encounter (Signed)
NO

## 2016-07-27 NOTE — Telephone Encounter (Signed)
Hydrocodone-Acetaminophen 5/325mg    21Tablets  Take 1 tablet by mouth every 8 (eight) hours as needed for moderate pain.     Ibuprofen 800 mg 90 tablets  Take 1 tablet (800 mg total) by mouth every 8 (eight) hours as needed.

## 2016-07-28 ENCOUNTER — Encounter: Payer: Self-pay | Admitting: Orthopedic Surgery

## 2016-07-28 ENCOUNTER — Ambulatory Visit (INDEPENDENT_AMBULATORY_CARE_PROVIDER_SITE_OTHER): Payer: Medicaid Other | Admitting: Orthopedic Surgery

## 2016-07-28 VITALS — BP 103/63 | HR 93 | Wt 157.0 lb

## 2016-07-28 DIAGNOSIS — G5602 Carpal tunnel syndrome, left upper limb: Secondary | ICD-10-CM

## 2016-07-28 DIAGNOSIS — Z4789 Encounter for other orthopedic aftercare: Secondary | ICD-10-CM

## 2016-07-28 MED ORDER — PREDNISONE 10 MG (48) PO TBPK: ORAL_TABLET | Freq: Every day | ORAL | 0 refills | Status: DC

## 2016-07-28 NOTE — Progress Notes (Signed)
Patient ID: Summer Golden, female   DOB: 12/02/1980, 35 y.o.   MRN: [id number]  Post op visit   Chief Complaint  Patient presents with  . Follow-up    post op Lt CTR, 06/19/16    BP 103/63   Pulse 93   Wt 157 lb (71.2 kg)   BMI 28.6372 kg/m   35 year old female had left carpal tunnel release complains of pain in her left thumb. She says initially she went to the doctor for pain in the left thumb he was concerned about carpal tunnel so she went for carpal tunnel test that was positive she eventually had carpal tunnel release. She does complain of some incisional pain. Most of her numbness and tingling has resolved but the pain in her thumb still persists  Incision is tender but only 6 weeks out so that's not unexpected. There are no signs of infection she can make a full fist. Her sensory exam has returned to normal color looks good she does have tenderness at the base of her thumb so there is question of CMC arthritis    She is placed on a 12 day Sterapred Dosepak follow-up 2 weeks x-ray left hand

## 2016-08-10 ENCOUNTER — Ambulatory Visit: Payer: Medicaid Other | Admitting: Orthopedic Surgery

## 2016-08-17 ENCOUNTER — Ambulatory Visit (INDEPENDENT_AMBULATORY_CARE_PROVIDER_SITE_OTHER): Payer: Medicaid Other

## 2016-08-17 ENCOUNTER — Ambulatory Visit (INDEPENDENT_AMBULATORY_CARE_PROVIDER_SITE_OTHER): Payer: Medicaid Other | Admitting: Orthopedic Surgery

## 2016-08-17 ENCOUNTER — Encounter: Payer: Self-pay | Admitting: Orthopedic Surgery

## 2016-08-17 DIAGNOSIS — Z4789 Encounter for other orthopedic aftercare: Secondary | ICD-10-CM

## 2016-08-17 DIAGNOSIS — G5602 Carpal tunnel syndrome, left upper limb: Secondary | ICD-10-CM | POA: Diagnosis not present

## 2016-08-17 NOTE — Progress Notes (Signed)
Chief Complaint  Patient presents with  . Follow-up    left hand, CTR 06/19/16    Postop visit for Summer Golden. Her hand has improved. We got an x-ray of her CMC joint to rule out arthritis it was normal. Her incision healed nicely she can make a full fist she can oppose the thumb to the small finger  She can do normal activities. She will call us when she wants her other carpal tunnel done

## 2016-09-30 ENCOUNTER — Other Ambulatory Visit: Payer: Self-pay | Admitting: Neurology

## 2016-10-09 ENCOUNTER — Other Ambulatory Visit (HOSPITAL_COMMUNITY): Payer: Self-pay | Admitting: Neurology

## 2016-10-09 ENCOUNTER — Ambulatory Visit (HOSPITAL_COMMUNITY)
Admission: RE | Admit: 2016-10-09 | Discharge: 2016-10-09 | Disposition: A | Discharge status: Home / Self Care | Payer: Medicaid Other | Source: Ambulatory Visit | Attending: Neurology | Admitting: Neurology

## 2016-10-09 DIAGNOSIS — M5440 Lumbago with sciatica, unspecified side: Secondary | ICD-10-CM | POA: Diagnosis not present

## 2016-11-27 ENCOUNTER — Other Ambulatory Visit: Payer: Self-pay | Admitting: Neurology

## 2016-11-27 DIAGNOSIS — R29898 Other symptoms and signs involving the musculoskeletal system: Secondary | ICD-10-CM

## 2016-11-27 DIAGNOSIS — M545 Low back pain: Secondary | ICD-10-CM

## 2016-11-30 ENCOUNTER — Other Ambulatory Visit: Payer: Medicaid Other

## 2017-01-05 DIAGNOSIS — F17210 Nicotine dependence, cigarettes, uncomplicated: Secondary | ICD-10-CM | POA: Insufficient documentation

## 2017-01-05 DIAGNOSIS — Y929 Unspecified place or not applicable: Secondary | ICD-10-CM | POA: Insufficient documentation

## 2017-01-05 DIAGNOSIS — S20211A Contusion of right front wall of thorax, initial encounter: Secondary | ICD-10-CM | POA: Insufficient documentation

## 2017-01-05 DIAGNOSIS — W1839XA Other fall on same level, initial encounter: Secondary | ICD-10-CM | POA: Insufficient documentation

## 2017-01-05 DIAGNOSIS — F1290 Cannabis use, unspecified, uncomplicated: Secondary | ICD-10-CM | POA: Insufficient documentation

## 2017-01-05 DIAGNOSIS — Y9389 Activity, other specified: Secondary | ICD-10-CM | POA: Insufficient documentation

## 2017-01-05 DIAGNOSIS — Y999 Unspecified external cause status: Secondary | ICD-10-CM | POA: Insufficient documentation

## 2017-01-05 DIAGNOSIS — J45909 Unspecified asthma, uncomplicated: Secondary | ICD-10-CM | POA: Insufficient documentation

## 2017-01-06 ENCOUNTER — Emergency Department (HOSPITAL_COMMUNITY)
Admission: EM | Admit: 2017-01-06 | Discharge: 2017-01-06 | Disposition: A | Discharge status: Home / Self Care | Payer: Self-pay | Attending: Emergency Medicine | Admitting: Emergency Medicine

## 2017-01-06 ENCOUNTER — Encounter (HOSPITAL_COMMUNITY): Payer: Self-pay | Admitting: *Deleted

## 2017-01-06 ENCOUNTER — Emergency Department (HOSPITAL_COMMUNITY): Payer: Self-pay

## 2017-01-06 DIAGNOSIS — S20211A Contusion of right front wall of thorax, initial encounter: Secondary | ICD-10-CM

## 2017-01-06 MED ORDER — OXYCODONE-ACETAMINOPHEN 5-325 MG PO TABS: 1.0000 | ORAL_TABLET | ORAL | 0 refills | Status: DC | PRN

## 2017-01-06 MED ORDER — OXYCODONE-ACETAMINOPHEN 5-325 MG PO TABS
1.0000 | ORAL_TABLET | Freq: Once | ORAL | Status: AC
  Administered 2017-01-06: 1 via ORAL
  Filled 2017-01-06: qty 1
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
## 2017-01-06 NOTE — ED Notes (Signed)
Pt alert & oriented x4, stable gait. Patient given discharge instructions, paperwork & prescription(s). Patient  instructed to stop at the registration desk to finish any additional paperwork. Patient verbalized understanding. Pt left department w/ no further questions. 

## 2017-01-06 NOTE — Discharge Instructions (Signed)
Take ibuprofen, acetaminophen, and tramadol for less severe pain.  You will need to see your doctor if you need additional narcotic prescriptions.
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
## 2017-01-11 MED FILL — Oxycodone w/ Acetaminophen Tab 5-325 MG: ORAL | Qty: 6 | Status: AC

## 2018-10-02 ENCOUNTER — Encounter (HOSPITAL_COMMUNITY): Payer: Self-pay | Admitting: *Deleted

## 2018-10-02 ENCOUNTER — Emergency Department (HOSPITAL_COMMUNITY): Payer: BLUE CROSS/BLUE SHIELD

## 2018-10-02 ENCOUNTER — Emergency Department (HOSPITAL_COMMUNITY)
Admission: EM | Admit: 2018-10-02 | Discharge: 2018-10-02 | Disposition: A | Discharge status: Home / Self Care | Payer: BLUE CROSS/BLUE SHIELD | Attending: Emergency Medicine | Admitting: Emergency Medicine

## 2018-10-02 DIAGNOSIS — S6392XA Sprain of unspecified part of left wrist and hand, initial encounter: Secondary | ICD-10-CM | POA: Diagnosis not present

## 2018-10-02 DIAGNOSIS — Z79899 Other long term (current) drug therapy: Secondary | ICD-10-CM | POA: Diagnosis not present

## 2018-10-02 DIAGNOSIS — Y999 Unspecified external cause status: Secondary | ICD-10-CM | POA: Diagnosis not present

## 2018-10-02 DIAGNOSIS — W228XXA Striking against or struck by other objects, initial encounter: Secondary | ICD-10-CM | POA: Diagnosis not present

## 2018-10-02 DIAGNOSIS — F17210 Nicotine dependence, cigarettes, uncomplicated: Secondary | ICD-10-CM | POA: Diagnosis not present

## 2018-10-02 DIAGNOSIS — Y9389 Activity, other specified: Secondary | ICD-10-CM | POA: Insufficient documentation

## 2018-10-02 DIAGNOSIS — J45909 Unspecified asthma, uncomplicated: Secondary | ICD-10-CM | POA: Insufficient documentation

## 2018-10-02 DIAGNOSIS — S6992XA Unspecified injury of left wrist, hand and finger(s), initial encounter: Secondary | ICD-10-CM | POA: Diagnosis present

## 2018-10-02 DIAGNOSIS — Y929 Unspecified place or not applicable: Secondary | ICD-10-CM | POA: Insufficient documentation

## 2018-10-02 NOTE — Discharge Instructions (Addendum)
You have been diagnosed today with left hand sprain. At this time there does not appear to be the presence of an emergent medical condition, however there is always the potential for conditions to change. Please read and follow the below instructions.  Please return to the Emergency Department immediately for any new or worsening symptoms. Please be sure to follow up with your Primary Care Provider next week regarding your visit today; please call their office to schedule an appointment even if you are feeling better for a follow-up visit. Please follow-up with the orthopedic specialist Dr. Romeo AppleHarrison for further evaluation of your hand pain.  Call his office tomorrow to schedule an appointment. Please use rest ice and elevation to help with your symptoms.  You may use the brace provided today to help protect the area.  Please avoid overuse of the hand because this will increase your pain.  Get help right away if: Your hand becomes warm, red, or swollen. Your hand is numb or tingling. Your hand is extremely swollen or deformed. Your hand or fingers turn white or blue. You cannot move your hand, wrist, or fingers.  Please read the additional information packets attached to your discharge summary.  Do not take your medicine if  develop an itchy rash, swelling in your mouth or lips, or difficulty breathing.
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
## 2018-10-02 NOTE — ED Notes (Signed)
Slight swelling noted to PP third finger
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
## 2018-10-02 NOTE — ED Notes (Signed)
PA in to eval

## 2018-10-02 NOTE — ED Notes (Signed)
Awaiting DC paperwork 

## 2019-01-02 ENCOUNTER — Encounter (HOSPITAL_COMMUNITY): Payer: Self-pay | Admitting: Emergency Medicine

## 2019-01-02 ENCOUNTER — Emergency Department (HOSPITAL_COMMUNITY)
Admission: EM | Admit: 2019-01-02 | Discharge: 2019-01-02 | Disposition: A | Discharge status: Home / Self Care | Payer: BLUE CROSS/BLUE SHIELD | Attending: Emergency Medicine | Admitting: Emergency Medicine

## 2019-01-02 ENCOUNTER — Other Ambulatory Visit: Payer: Self-pay

## 2019-01-02 DIAGNOSIS — G43009 Migraine without aura, not intractable, without status migrainosus: Secondary | ICD-10-CM | POA: Insufficient documentation

## 2019-01-02 DIAGNOSIS — Z79899 Other long term (current) drug therapy: Secondary | ICD-10-CM | POA: Diagnosis not present

## 2019-01-02 DIAGNOSIS — J45909 Unspecified asthma, uncomplicated: Secondary | ICD-10-CM | POA: Diagnosis not present

## 2019-01-02 DIAGNOSIS — B850 Pediculosis due to Pediculus humanus capitis: Secondary | ICD-10-CM | POA: Diagnosis not present

## 2019-01-02 DIAGNOSIS — L299 Pruritus, unspecified: Secondary | ICD-10-CM | POA: Diagnosis present

## 2019-01-02 DIAGNOSIS — F17210 Nicotine dependence, cigarettes, uncomplicated: Secondary | ICD-10-CM | POA: Insufficient documentation

## 2019-01-02 MED ORDER — KETOROLAC TROMETHAMINE 30 MG/ML IJ SOLN
30.0000 mg | Freq: Once | INTRAMUSCULAR | Status: AC
  Administered 2019-01-02: 30 mg via INTRAMUSCULAR
  Filled 2019-01-02: qty 1

## 2019-01-02 MED ORDER — PERMETHRIN 1 % EX LOTN: 1.0000 "application " | TOPICAL_LOTION | Freq: Once | CUTANEOUS | 0 refills | Status: AC

## 2019-01-02 MED ORDER — PROCHLORPERAZINE EDISYLATE 10 MG/2ML IJ SOLN
10.0000 mg | Freq: Once | INTRAMUSCULAR | Status: AC
Start: 2019-01-02 — End: 2019-01-02
  Administered 2019-01-02: 10 mg via INTRAMUSCULAR
  Filled 2019-01-02: qty 2

## 2019-01-02 MED ORDER — DIPHENHYDRAMINE HCL 50 MG/ML IJ SOLN
25.0000 mg | Freq: Once | INTRAMUSCULAR | Status: AC
  Administered 2019-01-02: 25 mg via INTRAMUSCULAR
  Filled 2019-01-02: qty 1

## 2019-01-02 NOTE — ED Triage Notes (Signed)
PT c/o recurrent abscess and multiple areas to back of head and neck since Jan this year. PT denies any drainage to areas and denies any fever.

## 2019-01-02 NOTE — Discharge Instructions (Signed)
Use the shampoo as instructed. You may need to repeat the treatment in 10 days if you are still having symptoms.  I recommend ibuprofen  or tylenol if needed for persistent scalp discomfort.
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
## 2022-07-01 ENCOUNTER — Telehealth: Payer: Self-pay

## 2022-07-01 NOTE — Telephone Encounter (Signed)
Attempted call to follow up with client. [employer] application resent this am and per [employer] client is showing insurance. Have not scheduled [facility] appointment until further information or proof of cancelled insurance.  No answer, left message requesting return call to care connect main line or this RN's number  Francee Nodal RN Clara Gunn/[facility]

## 2022-07-27 ENCOUNTER — Telehealth: Payer: Self-pay

## 2022-07-27 NOTE — Telephone Encounter (Signed)
Received e-mail today from [place] care guide of [employer] [facility] that client has been approved for [employer] until 06/03/23 and has proved no insurance. She requests a call to client to set up her first appointment to establish primary medical care with [facility].  Called client. Appointment is set for 07/30/22 at 1:00 PM client is to contact me early tomorrow if she is unable to get off work and would need to reschedule. At client's request sent appointment date and time along with [facility] address and phone number to her. Also reminded client to call this RN early tomorrow to confirm she can be off work Thursday for her appointment. She states understanding.    Plan: will follow up after her last appointment.  Client does report history of anxiety. Briefly discussed MH options if needed would be [facility] (client prefers not to use them due to past experience) Discussed also [facility] could be an option if she would need a psychiatrist to prescribe if recommended by [facility] provider. States understanding.  [employer]

## 2022-07-30 ENCOUNTER — Encounter: Payer: Self-pay | Admitting: Physician Assistant

## 2022-07-30 ENCOUNTER — Ambulatory Visit: Payer: Self-pay | Admitting: Physician Assistant

## 2022-07-30 VITALS — BP 115/74 | HR 80 | Temp 97.7°F | Wt 182.0 lb

## 2022-07-30 DIAGNOSIS — Z7689 Persons encountering health services in other specified circumstances: Secondary | ICD-10-CM

## 2022-07-30 DIAGNOSIS — F419 Anxiety disorder, unspecified: Secondary | ICD-10-CM

## 2022-07-30 DIAGNOSIS — R011 Cardiac murmur, unspecified: Secondary | ICD-10-CM

## 2022-07-30 DIAGNOSIS — M79672 Pain in left foot: Secondary | ICD-10-CM

## 2022-07-30 DIAGNOSIS — Z131 Encounter for screening for diabetes mellitus: Secondary | ICD-10-CM

## 2022-07-30 DIAGNOSIS — F17200 Nicotine dependence, unspecified, uncomplicated: Secondary | ICD-10-CM

## 2022-07-30 DIAGNOSIS — Z1239 Encounter for other screening for malignant neoplasm of breast: Secondary | ICD-10-CM

## 2022-07-30 DIAGNOSIS — Z13220 Encounter for screening for lipoid disorders: Secondary | ICD-10-CM

## 2022-07-30 DIAGNOSIS — E669 Obesity, unspecified: Secondary | ICD-10-CM

## 2022-07-30 MED ORDER — BUPROPION HCL ER (SR) 100 MG PO TB12: 100.0000 mg | ORAL_TABLET | Freq: Two times a day (BID) | ORAL | 0 refills | Status: DC

## 2022-07-30 NOTE — Progress Notes (Signed)
BP 115/74   Pulse 80   Temp 97.7 F (36.5 C)   Wt 182 lb (82.6 kg)   SpO2 98%   BMI 35.54 kg/m    Subjective:    Patient ID: Summer Golden, female    DOB: Aug 16, 1981, 41 y.o.   MRN: [id number]  HPI: Summer Golden is a 41 y.o. female presenting on 07/30/2022 for New Patient (Initial Visit)   HPI   Chief Complaint  Patient presents with   New Patient (Initial Visit)    Pt is 41yoF who is currently Working AT [employer] for the past 3 month.  She has been Living in [place] about 1 1/2 yr   She reports Recent anxiety and  A little depression.   She Also has worries about her 18yo daughter who lives in [place].     She was on MH meds in the past.  She says she had allergy to some - prozac, lexapro and celexa- which all made her feel suicidal.  She says she had a mouth swab at [facility] in [place]  [place] to see which med would be best for her.    She was on wellbutrin and wants to get back on that.   She says she has no feelings of SI on the wellbutrin.   She was ttreated at the [facility] for 7 months for substance abuse and MH issues.   She says currently she is having no SI or HI.   She says she has never had a mammogram.  She has not gotten any covid vacciinations.  Pt says her foot sometimes hurts.  She stands for long hours at work.  She is wearing a type of plastic clog shoe.    Relevant past medical, surgical, family and social history reviewed and updated as indicated. Interim medical history since our last visit reviewed. Allergies and medications reviewed and updated.   Current Outpatient Medications:    etonogestrel (IMPLANON) 68 MG IMPL implant, Inject 1 each into the skin once., Disp: , Rfl:     Review of Systems  Per HPI unless specifically indicated above     Objective:    BP 115/74   Pulse 80   Temp 97.7 F (36.5 C)   Wt 182 lb (82.6 kg)   SpO2 98%   BMI 35.54 kg/m   Wt Readings from Last 3 Encounters:   07/30/22 182 lb (82.6 kg)  01/02/19 142 lb (64.4 kg)  10/02/18 142 lb (64.4 kg)    Physical Exam Vitals reviewed.  Constitutional:      General: She is not in acute distress.    Appearance: She is well-developed. She is not toxic-appearing.  HENT:     Head: Normocephalic and atraumatic.     Right Ear: Tympanic membrane, ear canal and external ear normal.     Left Ear: Tympanic membrane, ear canal and external ear normal.  Eyes:     Extraocular Movements: Extraocular movements intact.     Conjunctiva/sclera: Conjunctivae normal.     Pupils: Pupils are equal, round, and reactive to light.  Neck:     Thyroid: No thyromegaly.  Cardiovascular:     Rate and Rhythm: Normal rate and regular rhythm.     Pulses:          Dorsalis pedis pulses are 2+ on the right side and 2+ on the left side.  Pulmonary:     Effort: Pulmonary effort is normal.     Breath  sounds: Normal breath sounds.  Abdominal:     General: Bowel sounds are normal.     Palpations: Abdomen is soft. There is no mass.     Tenderness: There is no abdominal tenderness.  Musculoskeletal:     Cervical back: Neck supple.     Right lower leg: No edema.     Left lower leg: No edema.     Right ankle: Normal range of motion.     Left ankle: Normal range of motion.     Right foot: Normal range of motion. Normal pulse.     Left foot: Normal range of motion. Normal pulse.       Feet:  Feet:     Comments: Pt has several corns on plantar surface both feet, some peeling skin, no open wounds.  Feet flat.  Good pulse and cap refill.  Mild tenderness left foot posterior and inferior to malleolus as pictured.  No bruising or wound.  FROM.  Lymphadenopathy:     Cervical: No cervical adenopathy.  Skin:    General: Skin is warm and dry.     Comments: Nails bitten on both hands  Neurological:     Mental Status: She is alert and oriented to person, place, and time.     Motor: No weakness or tremor.     Gait: Gait normal.     Deep  Tendon Reflexes:     Reflex Scores:      Patellar reflexes are 2+ on the right side and 2+ on the left side. Psychiatric:        Attention and Perception: Attention normal.        Speech: Speech normal.        Behavior: Behavior normal. Behavior is cooperative.     Comments: Pt is engaged.       GAD 7 screening- 15 PHQ 9 screening - 12     Assessment & Plan:     Encounter Diagnoses  Name Primary?   Encounter to establish care Yes   Anxiety and depression    Tobacco use disorder    Encounter for screening for malignant neoplasm of breast, unspecified screening modality    Screening cholesterol level    Screening for diabetes mellitus    Class 2 obesity with body mass index (BMI) of 35.0 to 35.9 in adult, unspecified obesity type, unspecified whether serious comorbidity present    Murmur    Left foot pain      -She is off work on Mondays and Fridays  -refer for screening Mammogram -refer for Echo -pt was given Cafa/application for [employer] financial assistance -get baseline Cmp, lipids, a1c -Rx wellbutrin and appointment scheduled with [facility] -pt encouraged to change shoes; recommended shoes that lace (current shoes gape open).  Inserts.  Ice the area 10-20 minutes after returning home from work and elevate the feet. -pt to follow up here 2-3 weeks.  She is to contact office sooner prn

## 2022-08-03 ENCOUNTER — Telehealth: Payer: Self-pay

## 2022-08-03 ENCOUNTER — Other Ambulatory Visit (HOSPITAL_COMMUNITY)
Admission: RE | Admit: 2022-08-03 | Discharge: 2022-08-03 | Disposition: A | Discharge status: Home / Self Care | Payer: Self-pay | Source: Ambulatory Visit | Attending: Physician Assistant | Admitting: Physician Assistant

## 2022-08-03 DIAGNOSIS — Z13220 Encounter for screening for lipoid disorders: Secondary | ICD-10-CM | POA: Insufficient documentation

## 2022-08-03 DIAGNOSIS — F32A Depression, unspecified: Secondary | ICD-10-CM | POA: Insufficient documentation

## 2022-08-03 DIAGNOSIS — E669 Obesity, unspecified: Secondary | ICD-10-CM | POA: Insufficient documentation

## 2022-08-03 DIAGNOSIS — Z131 Encounter for screening for diabetes mellitus: Secondary | ICD-10-CM | POA: Insufficient documentation

## 2022-08-03 DIAGNOSIS — F419 Anxiety disorder, unspecified: Secondary | ICD-10-CM | POA: Insufficient documentation

## 2022-08-03 DIAGNOSIS — Z6835 Body mass index (BMI) 35.0-35.9, adult: Secondary | ICD-10-CM | POA: Insufficient documentation

## 2022-08-03 LAB — COMPREHENSIVE METABOLIC PANEL
ALT: 25 U/L (ref 0–44)
AST: 24 U/L (ref 15–41)
Albumin: 3.8 g/dL (ref 3.5–5.0)
Alkaline Phosphatase: 99 U/L (ref 38–126)
Anion gap: 9 (ref 5–15)
BUN: 11 mg/dL (ref 6–20)
CO2: 24 mmol/L (ref 22–32)
Calcium: 8.7 mg/dL — ABNORMAL LOW (ref 8.9–10.3)
Chloride: 106 mmol/L (ref 98–111)
Creatinine, Ser: 0.68 mg/dL (ref 0.44–1.00)
GFR, Estimated: >60 mL/min (ref >60)
Glucose, Bld: 86 mg/dL (ref 70–99)
Potassium: 4.1 mmol/L (ref 3.5–5.1)
Sodium: 139 mmol/L (ref 135–145)
Total Bilirubin: 0.7 mg/dL (ref 0.3–1.2)
Total Protein: 7.1 g/dL (ref 6.5–8.1)

## 2022-08-03 LAB — LIPID PANEL
Cholesterol: 200 mg/dL (ref 0–200)
HDL: 52 mg/dL (ref >40)
LDL Cholesterol: 135 mg/dL — ABNORMAL HIGH (ref 0–99)
Total CHOL/HDL Ratio: 3.8 RATIO
Triglycerides: 63 mg/dL (ref <150)
VLDL: 13 mg/dL (ref 0–40)

## 2022-08-03 LAB — TSH: TSH: 1.019 u[IU]/mL (ref 0.350–4.500)

## 2022-08-03 LAB — HEMOGLOBIN A1C
Hgb A1c MFr Bld: 5.1 % (ref 4.8–5.6)
Mean Plasma Glucose: 99.67 mg/dL

## 2022-08-03 NOTE — Telephone Encounter (Signed)
Telephoned patient at home number. Left a voice message with [facility] contact information. 

## 2022-08-04 ENCOUNTER — Other Ambulatory Visit (HOSPITAL_COMMUNITY): Payer: Self-pay | Admitting: Obstetrics and Gynecology

## 2022-08-04 DIAGNOSIS — N644 Mastodynia: Secondary | ICD-10-CM

## 2022-08-07 ENCOUNTER — Ambulatory Visit (HOSPITAL_COMMUNITY)
Admission: RE | Admit: 2022-08-07 | Discharge: 2022-08-07 | Disposition: A | Discharge status: Home / Self Care | Payer: BLUE CROSS/BLUE SHIELD | Source: Ambulatory Visit | Attending: Physician Assistant | Admitting: Physician Assistant

## 2022-08-07 DIAGNOSIS — R011 Cardiac murmur, unspecified: Secondary | ICD-10-CM

## 2022-08-07 LAB — ECHOCARDIOGRAM COMPLETE
AR max vel: 1.87 cm2
AV Area VTI: 1.77 cm2
AV Area mean vel: 1.74 cm2
AV Mean grad: 7 mmHg
AV Peak grad: 11.7 mmHg
Ao pk vel: 1.71 m/s
Area-P 1/2: 3.27 cm2
S' Lateral: 2.4 cm

## 2022-08-07 NOTE — Progress Notes (Signed)
*  PRELIMINARY RESULTS* Echocardiogram 2D Echocardiogram has been performed.  Summer Golden 08/07/2022, 3:46 PM

## 2022-08-11 ENCOUNTER — Ambulatory Visit: Payer: Self-pay

## 2022-08-11 DIAGNOSIS — F32A Depression, unspecified: Secondary | ICD-10-CM

## 2022-08-17 ENCOUNTER — Ambulatory Visit: Payer: Self-pay | Admitting: Physician Assistant

## 2022-08-17 ENCOUNTER — Encounter: Payer: Self-pay | Admitting: Physician Assistant

## 2022-08-17 VITALS — BP 110/68 | HR 82 | Temp 97.3°F | Wt 180.0 lb

## 2022-08-17 DIAGNOSIS — F17200 Nicotine dependence, unspecified, uncomplicated: Secondary | ICD-10-CM

## 2022-08-17 DIAGNOSIS — M79672 Pain in left foot: Secondary | ICD-10-CM

## 2022-08-17 DIAGNOSIS — B354 Tinea corporis: Secondary | ICD-10-CM

## 2022-08-17 DIAGNOSIS — F32A Depression, unspecified: Secondary | ICD-10-CM

## 2022-08-17 DIAGNOSIS — R011 Cardiac murmur, unspecified: Secondary | ICD-10-CM

## 2022-08-17 DIAGNOSIS — Z5989 Other problems related to housing and economic circumstances: Secondary | ICD-10-CM

## 2022-08-17 DIAGNOSIS — E785 Hyperlipidemia, unspecified: Secondary | ICD-10-CM

## 2022-08-17 MED ORDER — CLOTRIMAZOLE-BETAMETHASONE 1-0.05 % EX CREA: 1.0000 | TOPICAL_CREAM | Freq: Two times a day (BID) | CUTANEOUS | 0 refills | Status: DC

## 2022-08-17 NOTE — Patient Instructions (Signed)

## 2022-08-17 NOTE — Progress Notes (Unsigned)
BP 110/68   Pulse 82   Temp (!) 97.3 F (36.3 C)   Wt 180 lb (81.6 kg)   SpO2 99%   BMI 35.15 kg/m    Subjective:    Patient ID: Summer Golden, female    DOB: 1980-11-10, 41 y.o.   MRN: [id number]  HPI: Summer Golden is a 41 y.o. female presenting on 08/17/2022 for Follow-up   HPI   She says she is doing a whole lot better since getting on the wellbutrin.  She denies side effects or issues.   Anxiety/depression is a lot better.  She did not submit her cafa/[employer] financial assistance application  She changed her shoes.  She is wearing sneakers. She is still having pain aof  the left foot  She c/o Rash chest wall for years.  She admits to picking at it frequently    Relevant past medical, surgical, family and social history reviewed and updated as indicated. Interim medical history since our last visit reviewed. Allergies and medications reviewed and updated.    Current Outpatient Medications:    buPROPion ER (WELLBUTRIN SR) 100 MG 12 hr tablet, Take 1 tablet (100 mg total) by mouth 2 (two) times daily., Disp: 180 tablet, Rfl: 0   etonogestrel (IMPLANON) 68 MG IMPL implant, Inject 1 each into the skin once., Disp: , Rfl:    Multiple Vitamin (MULTI-VITAMIN PO), Take by mouth., Disp: , Rfl:     Review of Systems  Per HPI unless specifically indicated above     Objective:    BP 110/68   Pulse 82   Temp (!) 97.3 F (36.3 C)   Wt 180 lb (81.6 kg)   SpO2 99%   BMI 35.15 kg/m   Wt Readings from Last 3 Encounters:  08/17/22 180 lb (81.6 kg)  07/30/22 182 lb (82.6 kg)  01/02/19 142 lb (64.4 kg)    Physical Exam Vitals reviewed.  Constitutional:      General: She is not in acute distress.    Appearance: She is well-developed. She is not toxic-appearing.  HENT:     Head: Normocephalic and atraumatic.  Cardiovascular:     Rate and Rhythm: Normal rate and regular rhythm.  Pulmonary:     Effort: Pulmonary effort is normal.     Breath sounds: Normal  breath sounds.  Abdominal:     General: Bowel sounds are normal.     Palpations: Abdomen is soft. There is no mass.     Tenderness: There is no abdominal tenderness.  Musculoskeletal:     Cervical back: Neck supple.     Right lower leg: No edema.     Left lower leg: No edema.     Left foot: Normal range of motion. Tenderness present. No swelling or deformity.       Feet:     Comments: Tender medial aspect of the heel L foot  Lymphadenopathy:     Cervical: No cervical adenopathy.  Skin:    General: Skin is warm and dry.  Neurological:     Mental Status: She is alert and oriented to person, place, and time.  Psychiatric:        Behavior: Behavior normal.            Assessment & Plan:    Encounter Diagnoses  Name Primary?   Anxiety and depression Yes   Tobacco use disorder    Left foot pain    Tinea corporis    Hyperlipidemia, unspecified hyperlipidemia type  Uninsured    Murmur      -reviewed labs with pt  Mood/anxiety & depression -Continue wellbutrin -continue with [facility]   dyslipidemia -counseled pt on Lowfat diet -only mildly elevated.  No co-morbids except smoking so no Rx at this time  At risk for osteoporosis -recommended OTC calcium supplement  uninsured -pt was encouraged to Submit her cafa/application for [employer] financial assistance  murmur Reviewed echo with pt  Left foot pain -Xray left foot  HCM -she has appointment for screening mammogram  ringworm -Rx lotrisone for rash chest wall -she is urged to avoid scratching and picking at the area  Pt to follow up 1 month to recheck mood and foot.  She is to contact office sooner prn worsening or new symptoms

## 2022-08-18 ENCOUNTER — Ambulatory Visit: Payer: Self-pay

## 2022-08-22 ENCOUNTER — Ambulatory Visit (HOSPITAL_COMMUNITY)
Admission: RE | Admit: 2022-08-22 | Discharge: 2022-08-22 | Disposition: A | Discharge status: Home / Self Care | Payer: Self-pay | Source: Ambulatory Visit | Attending: Physician Assistant | Admitting: Physician Assistant

## 2022-08-22 ENCOUNTER — Emergency Department (HOSPITAL_COMMUNITY): Admission: EM | Admit: 2022-08-22 | Payer: Medicaid Other | Source: Home / Self Care

## 2022-08-22 DIAGNOSIS — M79672 Pain in left foot: Secondary | ICD-10-CM | POA: Insufficient documentation

## 2022-09-01 ENCOUNTER — Ambulatory Visit: Payer: Self-pay

## 2022-09-01 ENCOUNTER — Ambulatory Visit: Payer: Self-pay | Admitting: Physician Assistant

## 2022-09-01 ENCOUNTER — Encounter: Payer: Self-pay | Admitting: Physician Assistant

## 2022-09-01 VITALS — BP 110/78 | HR 82 | Temp 97.6°F | Wt 181.4 lb

## 2022-09-01 DIAGNOSIS — F17200 Nicotine dependence, unspecified, uncomplicated: Secondary | ICD-10-CM

## 2022-09-01 DIAGNOSIS — M79672 Pain in left foot: Secondary | ICD-10-CM

## 2022-09-01 DIAGNOSIS — Z5989 Other problems related to housing and economic circumstances: Secondary | ICD-10-CM

## 2022-09-01 DIAGNOSIS — F32A Depression, unspecified: Secondary | ICD-10-CM

## 2022-09-01 DIAGNOSIS — F339 Major depressive disorder, recurrent, unspecified: Secondary | ICD-10-CM

## 2022-09-01 NOTE — Progress Notes (Signed)
BP 110/78   Pulse 82   Temp 97.6 F (36.4 C)   Wt 181 lb 6.4 oz (82.3 kg)   SpO2 98%   BMI 35.43 kg/m    Subjective:    Patient ID: Summer Golden, female    DOB: November 12, 1980, 41 y.o.   MRN: [id number]  HPI: Summer Golden is a 41 y.o. female presenting on 09/01/2022 for Mental Health Problem and Foot Injury   HPI  Chief Complaint  Patient presents with   Maries Injury     She is doing well with the  wellbutrin.  She has appt with [facility] today.   She hasn't picked up the cream for her rash  She did not submit cafa/[employer] financial assistance application.   She was given application at appointment on 07/30/22.  Pt says her foot is hurting a lot.  She is wrapping it.  She stands a lot at work [employer]).  Xray was ordered at her appointment on 08/17/22.      Relevant past medical, surgical, family and social history reviewed and updated as indicated. Interim medical history since our last visit reviewed. Allergies and medications reviewed and updated.    Current Outpatient Medications:    buPROPion ER (WELLBUTRIN SR) 100 MG 12 hr tablet, Take 1 tablet (100 mg total) by mouth 2 (two) times daily., Disp: 180 tablet, Rfl: 0   etonogestrel (IMPLANON) 68 MG IMPL implant, Inject 1 each into the skin once., Disp: , Rfl:    Multiple Vitamin (MULTI-VITAMIN PO), Take by mouth., Disp: , Rfl:    clotrimazole-betamethasone (LOTRISONE) cream, Apply 1 Application topically 2 (two) times daily. (Patient not taking: Reported on 09/01/2022), Disp: 45 g, Rfl: 0   Review of Systems  Per HPI unless specifically indicated above     Objective:    BP 110/78   Pulse 82   Temp 97.6 F (36.4 C)   Wt 181 lb 6.4 oz (82.3 kg)   SpO2 98%   BMI 35.43 kg/m   Wt Readings from Last 3 Encounters:  09/01/22 181 lb 6.4 oz (82.3 kg)  08/17/22 180 lb (81.6 kg)  07/30/22 182 lb (82.6 kg)    Physical Exam Constitutional:      General: She is not in acute distress.     Appearance: She is not toxic-appearing.  HENT:     Head: Normocephalic and atraumatic.  Cardiovascular:     Rate and Rhythm: Normal rate and regular rhythm.  Pulmonary:     Effort: Pulmonary effort is normal. No respiratory distress.     Breath sounds: Normal breath sounds.  Musculoskeletal:     Right lower leg: No edema.     Left lower leg: No edema.     Left foot: Normal range of motion and normal capillary refill. Tenderness present. No swelling or deformity. Normal pulse.     Comments: Left foot tender medial aspect, inferior to the malleolus  Skin:    General: Skin is warm and dry.  Neurological:     Mental Status: She is alert and oriented to person, place, and time.  Psychiatric:        Behavior: Behavior normal.     Xray left foot- reviewed with pt/unremarkable (small spur not likely source of discomfort)      Assessment & Plan:    Encounter Diagnoses  Name Primary?   Anxiety and depression Yes   Tobacco use disorder    Left foot pain  Uninsured        -Refer to orthopedics.  Discussed that she should schedule appointment out a month or so to give her time to get her financial assistance application submitted and approved.   -recommended pt get shoe inserts to put in her shoes.   Recommended she ice the foot 10-20 minutes upon returning home from work.  She can ice the foot 3 or 4 times daily if desired.  Recommended she avoid wrapping ace overly tight.  Suggested she use an elastic sleeve instead.  Recommended she elevate the foot when not at work -continue wellbutrin for MH issues.  She has appointment with [facility] today -pt encouraged to get lotrisone cream from pharmacy.  It was prescribed for her shoulder rash -pt will follow up 4-6 weeks.  She is to contact office sooner prn

## 2022-09-01 NOTE — Progress Notes (Signed)
This is session # 2 with Summer Golden for individual counseling for Depression. Patient presents alone. BHP spent 50 minutes with patient.    Patient is presenting with depressive symptoms including feelings of sadness, low energy, hopelessness, and negative thought patterns. Symptoms are related to being without medication and lack of resources. Other symptoms discussed this visit include anger, irritability, and increased reactivity. Patient will likely benefit from individual counseling services with strengths-based intervention and coping skill development.   This procedure has been fully reviewed with the patient and informed consent has been obtained.   Patient location: Pt seen in clinic via telehealth.   I connected with Summer Golden on 09/01/22 by a video enabled telemedicine application and verified that I am speaking with the correct person.   I discussed the limitations of evaluation and management by telemedicine. The patient expressed understanding and agreed to proceed.     ASSESSMENT AND PLAN   Current diagnosis:  Depression (unspecified)   We created the following treatment plan at today's appointment:               1)  09/01/22               2)  Decrease depressive symptoms, and increase processing, coping skills, and supports and resources in order to decrease need for medication and improve overall level of life satisfaction and well-being.                3)  Continued medication management of Wellbutrin with Provider.               4)  Next screening due in 2 sessions.     HISTORY OF PRESENT ILLNESS   Mental Status: Summer Golden presented oriented X4. Mood and affect were depressed. Judgment was appropriate. Memory was appropriate. Thought processes and content were appropriate. Patient denies SI/HI.     Narrative: Met with pt for scheduled appointment. Person-centered approach used to continue therapeutic alliance. Explored hx and goals. Pt reports improvement overall of  depressive symptoms with medication but desire to increase skills in order to not need medication. Pt notes difficulty with anger and explores patterns and contributing factors. Assessed for any other concerns pt wanted to address today, which they denied. Scheduled follow up in 2 weeks.   Effectiveness of the intervention(s) and the beneficiary's response or progress toward goal(s):  Patient is in a planning stage of change to engage in treatment plan.

## 2022-09-11 ENCOUNTER — Inpatient Hospital Stay: Payer: Medicaid Other | Attending: Obstetrics and Gynecology

## 2022-09-14 ENCOUNTER — Ambulatory Visit: Payer: Self-pay | Admitting: Physician Assistant

## 2022-09-15 ENCOUNTER — Inpatient Hospital Stay (HOSPITAL_COMMUNITY): Admission: RE | Admit: 2022-09-15 | Payer: BLUE CROSS/BLUE SHIELD | Source: Ambulatory Visit

## 2022-09-29 ENCOUNTER — Ambulatory Visit: Payer: Self-pay | Admitting: Physician Assistant

## 2022-09-29 ENCOUNTER — Ambulatory Visit: Payer: Self-pay

## 2022-09-29 ENCOUNTER — Encounter: Payer: Self-pay | Admitting: Physician Assistant

## 2022-09-29 VITALS — BP 114/74 | HR 82 | Temp 97.2°F | Wt 186.0 lb

## 2022-09-29 DIAGNOSIS — F32A Depression, unspecified: Secondary | ICD-10-CM

## 2022-09-29 DIAGNOSIS — F17200 Nicotine dependence, unspecified, uncomplicated: Secondary | ICD-10-CM

## 2022-09-29 DIAGNOSIS — B354 Tinea corporis: Secondary | ICD-10-CM

## 2022-09-29 DIAGNOSIS — M79672 Pain in left foot: Secondary | ICD-10-CM

## 2022-09-29 NOTE — Progress Notes (Unsigned)
   BP 114/74   Pulse 82   Temp (!) 97.2 F (36.2 C)   Wt 186 lb (84.4 kg)   SpO2 99%   BMI 36.33 kg/m    Subjective:    Patient ID: Summer Golden, female    DOB: Oct 16, 1981, 41 y.o.   MRN: [id number]  HPI: Summer Golden is a 41 y.o. female presenting on 09/29/2022 for Follow-up   HPI  Pt is 41yoF who presents for follow up.   She gets Medicaid starting 10/02/22.  She says her Foot is worse- pain-  she was referred to [facility]opedics at [facility] on 10/31 but declined to make appt when she was called;  she wants to go after her medicaid starts  She says her rash is the same.  She hasn't picked up her lotrisone that was prescribed on 10/16.  She says she is Doing okay on wellbutrin.  She continues with [facility] and has appointment for counseling today.        Relevant past medical, surgical, family and social history reviewed and updated as indicated. Interim medical history since our last visit reviewed. Allergies and medications reviewed and updated.   Current Outpatient Medications:    buPROPion ER (WELLBUTRIN SR) 100 MG 12 hr tablet, Take 1 tablet (100 mg total) by mouth 2 (two) times daily., Disp: 180 tablet, Rfl: 0   clotrimazole-betamethasone (LOTRISONE) cream, Apply 1 Application topically 2 (two) times daily., Disp: 45 g, Rfl: 0   etonogestrel (IMPLANON) 68 MG IMPL implant, Inject 1 each into the skin once., Disp: , Rfl:    Multiple Vitamin (MULTI-VITAMIN PO), Take by mouth., Disp: , Rfl:     Review of Systems  Per HPI unless specifically indicated above     Objective:    BP 114/74   Pulse 82   Temp (!) 97.2 F (36.2 C)   Wt 186 lb (84.4 kg)   SpO2 99%   BMI 36.33 kg/m   Wt Readings from Last 3 Encounters:  09/29/22 186 lb (84.4 kg)  09/01/22 181 lb 6.4 oz (82.3 kg)  08/17/22 180 lb (81.6 kg)    Physical Exam Constitutional:      General: She is not in acute distress.    Appearance: She is not toxic-appearing.  HENT:     Head: Normocephalic and atraumatic.   Pulmonary:     Effort: Pulmonary effort is normal. No respiratory distress.  Skin:    General: Skin is warm and dry.     Comments: Rash on chest wall unchanged  Neurological:     Mental Status: She is alert and oriented to person, place, and time.  Psychiatric:        Attention and Perception: Attention normal.        Behavior: Behavior is cooperative.            Assessment & Plan:   Encounter Diagnoses  Name Primary?   Anxiety and depression Yes   Left foot pain    Tinea corporis    Tobacco use disorder      -pt is encouraged to get lotrisone cream for her rash -she will continue on the wellbutrin -she has been referred to orthopedics for her foot and can schedule at her desire - pt aware that she will need to schedule with new PCP as she will no longer be eligible at [facility] when her Medicaid goes into effect.  She states understanding

## 2022-10-02 ENCOUNTER — Ambulatory Visit: Payer: Medicaid Other | Admitting: Orthopedic Surgery

## 2022-10-02 ENCOUNTER — Encounter: Payer: Self-pay | Admitting: Orthopedic Surgery

## 2022-10-02 VITALS — BP 120/83 | HR 84 | Ht 60.0 in | Wt 183.0 lb

## 2022-10-02 DIAGNOSIS — M722 Plantar fascial fibromatosis: Secondary | ICD-10-CM

## 2022-10-02 NOTE — Patient Instructions (Signed)
Plantar Fasciitis Rehab Ask your health care provider which exercises are safe for you. Do exercises exactly as told by your health care provider and adjust them as directed. It is normal to feel mild stretching, pulling, tightness, or discomfort as you do these exercises. Stop right away if you feel sudden pain or your pain gets worse. Do not begin these exercises until told by your health care provider.   Stretching and range-of-motion exercises These exercises warm up your muscles and joints and improve the movement and flexibility of your foot. These exercises also help to relieve pain.   Plantar fascia stretch  Sit with your left / right leg crossed over your opposite knee. Hold your heel with one hand with that thumb near your arch. With your other hand, hold your toes and gently pull them back toward the top of your foot. You should feel a stretch on the base (bottom) of your toes, or the bottom of your foot (plantar fascia), or both. Hold this stretch 10 seconds. Slowly release your toes and return to the starting position. Repeat 10 times. Complete this exercise 1-2 times a day.   Gastrocnemius stretch, standing This exercise is also called a calf (gastroc) stretch. It stretches the muscles in the back of the upper calf. Stand with your hands against a wall. Extend your left / right leg behind you, and bend your front knee slightly. Keeping your heels on the floor, your toes facing forward, and your back knee straight, shift your weight toward the wall. Do not arch your back. You should feel a gentle stretch in your upper calf. Hold this position for 10 seconds. Repeat 10 times. Complete this exercise 1-2 times a day.   Soleus stretch, standing This exercise is also called a calf (soleus) stretch. It stretches the muscles in the back of the lower calf. Stand with your hands against a wall. Extend your left / right leg behind you, and bend your front knee slightly. Keeping your  heels on the floor and your toes facing forward, bend your back knee and shift your weight slightly over your back leg. You should feel a gentle stretch deep in your lower calf. Hold this position for 10 seconds. Repeat 10 times. Complete this exercise 1-2 times a day.   Gastroc and soleus stretch, standing step This exercise stretches the muscles in the back of the lower leg. These muscles are in the upper calf (gastrocnemius) and the lower calf (soleus). Stand with the ball of your left / right foot on the front of a step. The ball of your foot is on the walking surface, right under your toes. Keep your other foot firmly on the same step. Hold on to the wall or a railing for balance. Slowly lift your other foot, allowing your body weight to press your heel down over the edge of the front of the step. Keep knee straight and unbent. You should feel a stretch in your calf. Hold this position for 10 seconds. Return both feet to the step. Repeat this exercise with a slight bend in your left / right knee. Repeat 10 times with your left / right knee straight and 10 times with your left / right knee bent. Complete this exercise 1-2 times a day.   Balance exercise This exercise builds your balance and strength control of your arch to help take pressure off your plantar fascia.   Single leg stand If this exercise is too easy, you can try it with  your eyes closed or while standing on a pillow. Without shoes, stand near a railing or in a doorway. You may hold on to the railing or door frame as needed. Stand on your left / right foot. Keep your big toe down on the floor and lift the arch of your foot. You should feel a stretch across the bottom of your foot and your arch. Do not let your foot roll inward. Hold this position for 10 seconds. Repeat 10 times. Complete this exercise 1-2 times a day. This information is not intended to replace advice given to you by your health care provider. Make sure  you discuss any questions you have with your health care provider.

## 2022-10-02 NOTE — Progress Notes (Signed)
New Patient Visit  Assessment: Summer Golden is a 41 y.o. female with the following: 1. Plantar fasciitis of left foot  Plan: Summer Golden has pain in the left heel.  It worsens throughout the day.  Offered steroid injection, but she will hold off for now.  Continue with orthotics, medicines and topical treatments.  Home exercises provided.  Stress importance of stretching her heel cord.  Exercises demonstrated.  Return if not getting better or interested in an injection.   Follow-up: Return if symptoms worsen or fail to improve.  Subjective:  Chief Complaint  Patient presents with   Foot Pain    Lt foot pain for 1 yr getting worse.     History of Present Illness: Summer Golden is a 41 y.o. female who has been referred by Jacquelin Hawking, PA-C for evaluation of left foot pain.  She states she has had pain in the heel of the left foot for the past year.  It is progressively getting worse.  She has tried some orthotics, as well as heel cups.  These are not helping with her pain.  She stands for many hours throughout the day, and her pain gets worse.  She has tried medications.  No topical treatments.  No physical therapy.  No specific injury to her foot.   Review of Systems: No fevers or chills No numbness or tingling No chest pain No shortness of breath No bowel or bladder dysfunction No GI distress No headaches   Medical History:  Past Medical History:  Diagnosis Date   Anemia    Anxiety    Arthritis    Asthma    as child   Chronic chest wall pain    Chronic left shoulder pain    Chronic neck pain    Depressed    GERD (gastroesophageal reflux disease)    History of gout    Murmur    as child   Panic    Suicide attempt ([facility])    1.attempted overdose on narcotics, 2. attempted to cut self w/ rock 3. attempted to drive car off road.     Past Surgical History:  Procedure Laterality Date   APPENDECTOMY     CARPAL TUNNEL RELEASE Left 06/19/2016   Procedure: LEFT  CARPAL TUNNEL RELEASE;  Surgeon: Vickki Hearing, MD;  Location: [facility];  Service: Orthopedics;  Laterality: Left;   MYRINGOTOMY WITH TUBE PLACEMENT Bilateral     Family History  Problem Relation Age of Onset   Heart disease Mother    Diabetes Mother    COPD Father    Cancer Other    Diabetes Other    Social History   Tobacco Use   Smoking status: Every Day    Packs/day: 1.00    Years: 21.00    Total pack years: 21.00    Types: Cigarettes   Smokeless tobacco: Never  Vaping Use   Vaping Use: Former  Substance Use Topics   Alcohol use: No    Comment: history heavy etoh   Drug use: Yes    Types: Marijuana    Comment: MJ daily.   history of pain pills and adderall    Allergies  Allergen Reactions   Morphine Other (See Comments)    Makes her feel like she is drowning   Serotonin Reuptake Inhibitors (Ssris)     Prozac Lexapro Celexa -these all make her suicidal     No outpatient medications have been marked as taking for the 10/02/22 encounter (  Office Visit) with Oliver Barre, MD.    Objective: BP 120/83   Pulse 84   Ht 5' (1.524 m)   Wt 183 lb (83 kg)   BMI 35.74 kg/m   Physical Exam:  General: Alert and oriented. and No acute distress. Gait: Left sided antalgic gait.  Evaluation left foot demonstrates no swelling.  No bruising.  No redness.  Tenderness palpation in the plantar heel.  Limited dorsiflexion with her knee extended.  Few degrees of dorsiflexion with the knee bent.  Neutral hindfoot alignment.  Toes warm and well-perfused.  Sensation intact over the dorsum of the foot.  IMAGING: I personally reviewed images previously obtained in clinic  X-rays of the left foot were obtained in clinic previously.  Nonweightbearing views.  No acute injuries.  Plantar calcaneal heel spur.   New Medications:  No orders of the defined types were placed in this encounter.     Oliver Barre, MD  10/02/2022 10:13 PM

## 2022-10-13 ENCOUNTER — Ambulatory Visit: Payer: Self-pay

## 2022-10-20 ENCOUNTER — Ambulatory Visit: Payer: Self-pay

## 2022-10-20 NOTE — Progress Notes (Addendum)
Signed retroactively due to delayed EPIC access.   This is session # 3 with Eduardo for individual counseling for anxiety and depression. Patient presents alone. BHP spent 50 minutes with patient.    Patient is presenting with anxious symptoms including autonomic hyperactivity and racing thoughts, and depressive symptoms including feelings of sadness, low energy, hopelessness, and negative thought patterns. Symptoms are related to being without medication and lack of resources. Other symptoms discussed this visit include anger, irritability, and increased reactivity/hypervigilance. Patient will likely benefit from trauma-informed counseling services with strengths-based intervention and coping skill development.   This procedure has been fully reviewed with the patient and informed consent has been obtained.   Patient location: Pt seen in clinic via telehealth.   I connected with Enrica on 09/29/22 by a video enabled telemedicine application and verified that I am speaking with the correct person.   I discussed the limitations of evaluation and management by telemedicine. The patient expressed understanding and agreed to proceed.     ASSESSMENT AND PLAN   Current diagnosis:  anxiety and depression   We created the following treatment plan at today's appointment:               1)  09/29/22               2)  Decrease anxious and depressive symptoms, and increase processing, coping skills, and supports and resources in order to decrease need for medication and improve overall level of life satisfaction and well-being.                3)  Continued medication management of Wellbutrin with Provider.               4)  Next screening due in 2 sessions.     HISTORY OF PRESENT ILLNESS   Mental Status: Summer Golden presented oriented X4. Mood and affect were anxious and depressed. Judgment was appropriate. Memory was appropriate. Thought processes and content were appropriate. Patient denies SI/HI.      Narrative: Met with pt for scheduled appointment. Person-centered approach used to continue therapeutic alliance. Pt explored hx including traumatic experiences that may impact current functioning. Pt demonstrates increased openness around hx of SI and SUD.  Assessed for any other concerns pt wanted to address today, which they denied. Scheduled follow up in 2 weeks.   Effectiveness of the intervention(s) and the beneficiary's response or progress toward goal(s):  Patient is in a planning stage of change to engage in treatment plan.

## 2022-11-27 ENCOUNTER — Ambulatory Visit
Admission: EM | Admit: 2022-11-27 | Discharge: 2022-11-27 | Disposition: A | Discharge status: Home / Self Care | Payer: Medicaid Other | Attending: Emergency Medicine | Admitting: Emergency Medicine

## 2022-11-27 DIAGNOSIS — F419 Anxiety disorder, unspecified: Secondary | ICD-10-CM | POA: Diagnosis not present

## 2022-11-27 DIAGNOSIS — F32A Depression, unspecified: Secondary | ICD-10-CM | POA: Diagnosis not present

## 2022-11-27 MED ORDER — BUPROPION HCL ER (SR) 100 MG PO TB12: 100.0000 mg | ORAL_TABLET | Freq: Two times a day (BID) | ORAL | 0 refills | Status: DC

## 2022-11-27 MED ORDER — PROPRANOLOL HCL 20 MG PO TABS: 20.0000 mg | ORAL_TABLET | Freq: Two times a day (BID) | ORAL | 0 refills | Status: DC

## 2022-11-27 NOTE — Discharge Instructions (Signed)
Medication has been refilled for 3 months  Please continue to find primary doctor for further management and evaluation  If you have any immediate concerns about your anxiety or depression please go to the behavioral health center, information is listed on front page

## 2022-11-27 NOTE — ED Triage Notes (Signed)
Pt reports she needs a refill on her antidepressant meds.  Pt states she on propanolol and wellbutrin.
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
## 2022-12-31 ENCOUNTER — Encounter: Payer: Self-pay | Admitting: Radiology

## 2023-02-04 ENCOUNTER — Ambulatory Visit (INDEPENDENT_AMBULATORY_CARE_PROVIDER_SITE_OTHER): Payer: Medicaid Other | Admitting: Internal Medicine

## 2023-02-04 ENCOUNTER — Encounter: Payer: Self-pay | Admitting: Internal Medicine

## 2023-02-04 VITALS — BP 112/62 | HR 74 | Ht 60.0 in | Wt 187.6 lb

## 2023-02-04 DIAGNOSIS — Z720 Tobacco use: Secondary | ICD-10-CM | POA: Diagnosis not present

## 2023-02-04 DIAGNOSIS — E559 Vitamin D deficiency, unspecified: Secondary | ICD-10-CM | POA: Diagnosis not present

## 2023-02-04 DIAGNOSIS — Z114 Encounter for screening for human immunodeficiency virus [HIV]: Secondary | ICD-10-CM

## 2023-02-04 DIAGNOSIS — F331 Major depressive disorder, recurrent, moderate: Secondary | ICD-10-CM | POA: Diagnosis not present

## 2023-02-04 DIAGNOSIS — E782 Mixed hyperlipidemia: Secondary | ICD-10-CM

## 2023-02-04 DIAGNOSIS — Z1159 Encounter for screening for other viral diseases: Secondary | ICD-10-CM

## 2023-02-04 DIAGNOSIS — F411 Generalized anxiety disorder: Secondary | ICD-10-CM | POA: Diagnosis not present

## 2023-02-04 DIAGNOSIS — B354 Tinea corporis: Secondary | ICD-10-CM

## 2023-02-04 DIAGNOSIS — L989 Disorder of the skin and subcutaneous tissue, unspecified: Secondary | ICD-10-CM

## 2023-02-04 DIAGNOSIS — R739 Hyperglycemia, unspecified: Secondary | ICD-10-CM

## 2023-02-04 MED ORDER — BUPROPION HCL ER (SR) 100 MG PO TB12: 100.0000 mg | ORAL_TABLET | Freq: Two times a day (BID) | ORAL | 1 refills | Status: DC

## 2023-02-04 MED ORDER — PROPRANOLOL HCL 20 MG PO TABS: 20.0000 mg | ORAL_TABLET | Freq: Two times a day (BID) | ORAL | 0 refills | Status: DC

## 2023-02-04 MED ORDER — CLOTRIMAZOLE-BETAMETHASONE 1-0.05 % EX CREA
1.0000 | TOPICAL_CREAM | Freq: Two times a day (BID) | CUTANEOUS | 0 refills | Status: DC
Start: 2023-02-04 — End: 2024-01-20

## 2023-02-04 NOTE — Assessment & Plan Note (Addendum)
Overall controlled with propranolol Has episodes of anxiety and insomnia, takes Elavil as needed

## 2023-02-04 NOTE — Assessment & Plan Note (Signed)
Skin rash on chest wall area likely tinea corporis Started Lotrisone cream

## 2023-02-04 NOTE — Assessment & Plan Note (Signed)
Advised to follow low cholesterol diet for now 

## 2023-02-04 NOTE — Patient Instructions (Signed)
Please continue to take medications as prescribed.  Please continue to follow low carb diet and perform moderate exercise/walking at least 150 mins/week.  Please get fasting blood tests done before the next visit. 

## 2023-02-04 NOTE — Assessment & Plan Note (Signed)
Uncontrolled, but has been better with Wellbutrin 100 mg twice daily Since she has not tolerated other SSRIs and has responded the best with Wellbutrin, would keep same regimen for now Referred to [facility] therapy
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
## 2023-02-04 NOTE — Assessment & Plan Note (Signed)
Smokes about 0.5 pack/day, 1 pack/day X 20 years  Asked about quitting: confirms that he/she currently smokes cigarettes Advise to quit smoking: Educated about QUITTING to reduce the risk of cancer, cardio and cerebrovascular disease. Assess willingness: Unwilling to quit at this time, but is working on cutting back. Assist with counseling and pharmacotherapy: Counseled for 5 minutes and literature provided. On Wellbutrin. Arrange for follow up: follow up in 3 months and continue to offer help.

## 2023-02-04 NOTE — Assessment & Plan Note (Signed)
Has multiple skin lesions, likely seborrheic keratosis Referred to dermatology

## 2023-02-18 ENCOUNTER — Ambulatory Visit: Payer: Medicaid Other | Admitting: Professional Counselor

## 2023-02-18 DIAGNOSIS — F331 Major depressive disorder, recurrent, moderate: Secondary | ICD-10-CM | POA: Diagnosis not present

## 2023-02-18 NOTE — Patient Instructions (Addendum)
Patient will begin to implement mindfulness routines to improve self care. Patient received education on the grief process and how thinking patterns and emotions dictate behavior. Patient is recommend to take more therapeutic drives.   If your symptoms worsen or you have thoughts of suicide/homicide, PLEASE SEEK IMMEDIATE MEDICAL ATTENTION.  You may always call:   National Suicide Hotline: 988 or 209-450-4239 [place] Crisis Line: 781-833-7196 Crisis Recovery in [place]: [phone])172-0487     These are available 24 hours a day, 7 days a week.

## 2023-02-18 NOTE — BH Specialist Note (Addendum)
[facility] Initial Clinical Assessment  MRN: [id number] NAME: CAPRICE MCCAFFREY Date: 02/18/23  Start time: Start Time: 0300 End time: 4:00 Total time: 60 min Call number: Visit Number: 1- Initial Visit  Type of Contact:  Face to Face Patient consent obtained: Yes  Reason for Visit today:  Patient is a 42 y/o female referred to collaborative care for depression/anxiety by her PCP. Patient presents to visit overwhelmed with the news of her brother having a medical condition that is life threatening.   HPI: Patient has history of MDD and has been taking Wellbutrin for it. She is also taking propranolol for GAD.  She has history of polysubstance abuse and mood disorder due to it.  She has tried Celexa, Prozac and Lexapro.  She has had suicidal ideations with other antidepressants.  She still has apathy, insomnia and feels sluggish.  She denies any suicidal plan currently.  She has good support system currently.  She initially asked for discussing other antidepressants, but later stated that Wellbutrin has worked the best for her and she is alive because of it.  She was also prescribed Elavil while she was in [place], for insomnia.    Treatment History Patient recently received Inpatient Treatment:   Facility/Program:  [facility] for outpatient substance abuse and mental health treatment. June 2021.  Date of discharge:  June 2022 Patient currently being seen by therapist/psychiatrist:  No Patient currently receiving the following services:    Past Psychiatric History/Hospitalization(s): Anxiety: No Bipolar Disorder: No Depression: Yes Mania: No Psychosis: No Schizophrenia: No Personality Disorder: No Hospitalization for psychiatric illness: No History of Electroconvulsive Shock Therapy: No Prior Suicide Attempts:  Patient had thoughts of suicide in  2021. In 2020 patient lost her home, her father, and her kids were on drugs and she felt the need to get away. She felt  like she couldn't handle it and finally reached out for help. Patient also reports she has checked herself to emergency room for having suicidal thoughts. Patient currently has passive suicidal thoughts as evidence by statements like "I sometimes want it to end because its all too much". Counselor assessed further and determined patient has no intent or plan and determined she is low risk for suicide. Counselor provided patient with emergency resources in the even her symptoms escalate.   Clinical Assessment:  PHQ-9 Assessments:    02/18/2023    3:37 PM 02/04/2023    4:05 PM 07/30/2022    2:35 PM  Depression screen PHQ 2/9  Decreased Interest 2 1 1   Down, Depressed, Hopeless 1 1 2   PHQ - 2 Score 3 2 3   Altered sleeping 1 2 1   Tired, decreased energy 1 1 1   Change in appetite 1 1 1   Feeling bad or failure about yourself  2 3 2   Trouble concentrating 1 1 2   Moving slowly or fidgety/restless 1 2 1   Suicidal thoughts 0 1 1  PHQ-9 Score 10 13 12   Difficult doing work/chores Somewhat difficult Somewhat difficult Very difficult    GAD-7 Assessments:    02/18/2023    3:38 PM 02/04/2023    4:05 PM 07/30/2022    2:34 PM  GAD 7 : Generalized Anxiety Score  Nervous, Anxious, on Edge 2 1 3   Control/stop worrying 2 1 2   Worry too much - different things 1 1 3   Trouble relaxing 1 1 2   Restless 1 1 1   Easily annoyed or irritable 2 2 2   Afraid - awful might happen  1 2 2   Total GAD 7 Score 10 9 15   Anxiety Difficulty Very difficult Somewhat difficult Very difficult    Social Functioning Patient works full time and is productive at work. She has coworkers she enjoys working with and consider some of them friends. She is also in a healthy relationship where her boyfriend is supportive.  Stress Current stressors:  Patient reports her Brother is dying and that's her biggest stressor right now.  Familial stressors: Family conflict, unsupportive family  Sleep:  Patient reports sleeping through the  night with not much difficulty falling asleep  Appetite:  Patient reports eating well.  Coping ability:  Has a tendency to stuff emotions and isolate Patient taking medications as prescribed:  Propanolol 20 mg and Wellbutrin 100 mg taking as prescribed. Patient reports being satisfied with current medications.  Current medications:  Outpatient Encounter Medications as of 02/18/2023  Medication Sig   amitriptyline (ELAVIL) 25 MG tablet Take by mouth.   buPROPion ER (WELLBUTRIN SR) 100 MG 12 hr tablet Take 1 tablet (100 mg total) by mouth 2 (two) times daily.   clotrimazole-betamethasone (LOTRISONE) cream Apply 1 Application topically 2 (two) times daily.   etonogestrel (IMPLANON) 68 MG IMPL implant Inject 1 each into the skin once.   Multiple Vitamin (MULTI-VITAMIN PO) Take by mouth.   naproxen (NAPROSYN) 500 MG tablet Take by mouth. (Patient not taking: Reported on 02/04/2023)   propranolol (INDERAL) 20 MG tablet Take 1 tablet (20 mg total) by mouth 2 (two) times daily.   No facility-administered encounter medications on file as of 02/18/2023.    Self-harm Behaviors Risk Assessment Self-harm risk factors:  Grief, Guilt Patient endorses recent thoughts of harming self:  No  [place] Suicide Severity Rating Scale:     11/27/2022    2:54 PM  C-SRSS  1. Wish to be Dead No  2. Suicidal Thoughts No  6. Suicide Behavior Question No    Danger to Others Risk Assessment Danger to others risk factors:  No Patient endorses recent thoughts of harming others:  No  Dynamic Appraisal of Situational Aggression (DASA):      No data to display          Substance Use Assessment Patient recently consumed alcohol:  NO  Alcohol Use Disorder Identification Test (AUDIT):      No data to display         Patient recently used drugs:  Yes THC. Patient reports smoking THC a few days a week but does not feel she has a dependence.     Goals, Interventions and Follow-up Plan Goals: Increase healthy  adjustment to current life circumstances and Increase adequate support systems for patient/family Interventions: Motivational Interviewing and Solution-Focused Strategies Follow-up Plan:  Collaborative Care  Summary of Clinical Assessment Summary: The patient, a 42 year old female diagnosed with major depressive disorder recurrent, is seeking counseling due to overwhelming feelings triggered by her brother's terminal illness. She recounts a history of depression and substance use disorder, exacerbated by familial struggles, including her father's death in 04-07-2017 and her children's battles with addiction. Following a period of intensive outpatient treatment, during which she achieved sobriety and stability, she has functioned well but now feels a resurgence of depression in response to her brother's prognosis. Recognizing the need for early intervention, she seeks preventative support and education on grief as she prepares for her brother's impending passing. Neglecting her own self-care while prioritizing the needs of others, she requires guidance in setting boundaries and implementing self-care routines.  Given her extensive trauma history, specialized trauma therapy is recommended in addition to collaborative care discussions regarding pharmacological treatment options with a psychiatrist.    Reuel Boom

## 2023-03-03 ENCOUNTER — Telehealth (INDEPENDENT_AMBULATORY_CARE_PROVIDER_SITE_OTHER): Payer: BLUE CROSS/BLUE SHIELD | Admitting: Professional Counselor

## 2023-03-03 DIAGNOSIS — F331 Major depressive disorder, recurrent, moderate: Secondary | ICD-10-CM

## 2023-03-03 NOTE — BH Specialist Note (Unsigned)
Virtual Behavioral Health Treatment Plan Team Note  MRN: [id number] NAME: Summer Golden  DATE: 03/03/23  Start time: 4:10  End time:  4:35 Total time: 25 min   Total number of Virtual BH Treatment Team Plan encounters: 1/4  Treatment Team Attendees: Dr. Vanetta Shawl  Diagnoses: No diagnosis found.  Goals, Interventions and Follow-up Plan Goals: Increase healthy adjustment to current life circumstances Increase adequate support systems for patient/family Interventions: Motivational Interviewing Solution-Focused Strategies Medication Management Recommendations: N/A Pending further evaulation. Follow-up Plan: Conduct further evaluation for appropriateness for collaborative care.   History of the present illness Presenting Problem/Current Symptoms: ***  Psychiatric History  Depression: {BHH YES OR NO:22294} Anxiety: {BHH YES OR NO:22294} Mania: {BHH YES OR NO:22294} Psychosis: {BHH YES OR NO:22294} PTSD symptoms: {BHH YES OR NO:22294}  Past Psychiatric History/Hospitalization(s): Hospitalization for psychiatric illness: {BHH YES OR NO:22294} Prior Suicide Attempts: {BHH YES OR NO:22294} Prior Self-injurious behavior: {BHH YES OR XL:24401}  Psychosocial stressors   Self-harm Behaviors Risk Assessment   Screenings PHQ-9 Assessments:     02/18/2023    3:37 PM 02/04/2023    4:05 PM 07/30/2022    2:35 PM  Depression screen PHQ 2/9  Decreased Interest 2 1 1   Down, Depressed, Hopeless 1 1 2   PHQ - 2 Score 3 2 3   Altered sleeping 1 2 1   Tired, decreased energy 1 1 1   Change in appetite 1 1 1   Feeling bad or failure about yourself  2 3 2   Trouble concentrating 1 1 2   Moving slowly or fidgety/restless 1 2 1   Suicidal thoughts 0 1 1  PHQ-9 Score 10 13 12   Difficult doing work/chores Somewhat difficult Somewhat difficult Very difficult   GAD-7 Assessments:     02/18/2023    3:38 PM 02/04/2023    4:05 PM 07/30/2022    2:34 PM  GAD 7 : Generalized Anxiety Score  Nervous,  Anxious, on Edge 2 1 3   Control/stop worrying 2 1 2   Worry too much - different things 1 1 3   Trouble relaxing 1 1 2   Restless 1 1 1   Easily annoyed or irritable 2 2 2   Afraid - awful might happen 1 2 2   Total GAD 7 Score 10 9 15   Anxiety Difficulty Very difficult Somewhat difficult Very difficult    Past Medical History Past Medical History:  Diagnosis Date   Anemia    Anxiety    Arthritis    Asthma    as child   Chronic chest wall pain    Chronic left shoulder pain    Chronic neck pain    Depressed    GERD (gastroesophageal reflux disease)    History of gout    Murmur    as child   Panic    Suicide attempt (HCC)    1.attempted overdose on narcotics, 2. attempted to cut self w/ rock 3. attempted to drive car off road.     Vital signs: There were no vitals filed for this visit.  Allergies:  Allergies as of 03/03/2023 - Review Complete 02/04/2023  Allergen Reaction Noted   Morphine Other (See Comments) 07/16/2021   Serotonin reuptake inhibitors (ssris)  07/30/2022    Medication History Current medications:  Outpatient Encounter Medications as of 03/03/2023  Medication Sig   amitriptyline (ELAVIL) 25 MG tablet Take by mouth.   buPROPion ER (WELLBUTRIN SR) 100 MG 12 hr tablet Take 1 tablet (100 mg total) by mouth 2 (two) times daily.   clotrimazole-betamethasone (LOTRISONE) cream  Apply 1 Application topically 2 (two) times daily.   etonogestrel (IMPLANON) 68 MG IMPL implant Inject 1 each into the skin once.   Multiple Vitamin (MULTI-VITAMIN PO) Take by mouth.   naproxen (NAPROSYN) 500 MG tablet Take by mouth. (Patient not taking: Reported on 02/04/2023)   propranolol (INDERAL) 20 MG tablet Take 1 tablet (20 mg total) by mouth 2 (two) times daily.   No facility-administered encounter medications on file as of 03/03/2023.     Scribe for Treatment Team: Reuel Boom

## 2023-03-04 ENCOUNTER — Ambulatory Visit: Payer: Medicaid Other | Admitting: Professional Counselor

## 2023-03-11 ENCOUNTER — Ambulatory Visit (INDEPENDENT_AMBULATORY_CARE_PROVIDER_SITE_OTHER): Payer: BLUE CROSS/BLUE SHIELD | Admitting: Professional Counselor

## 2023-03-11 DIAGNOSIS — F331 Major depressive disorder, recurrent, moderate: Secondary | ICD-10-CM | POA: Diagnosis not present

## 2023-03-11 NOTE — BH Specialist Note (Addendum)
[facility] Collaborative Care Follow-up  MRN: [id number] NAME: Summer Golden Date: 03/11/23  Start time:  3;20 End time:  4:00 Total time:  40 min  Reason for visit today:  Patient is a 42 y/o female with a history of MDD and substance abuse. She is being seen today for collaborative care follow up. Patient presents agitated due to a work conflict. She was suspended from work for making inappropriate comments. Patient reports living a hard life and feels anger from her childhood. Feels like her anger is an issue and thinks it bothers her in her day to day. Patient is seeking support with managing her anger and stress.   PHQ-9 Scores:     03/11/2023    3:26 PM 02/18/2023    3:37 PM 02/04/2023    4:05 PM 07/30/2022    2:35 PM  Depression screen PHQ 2/9  Decreased Interest 1 2 1 1   Down, Depressed, Hopeless 1 1 1 2   PHQ - 2 Score 2 3 2 3   Altered sleeping 1 1 2 1   Tired, decreased energy 1 1 1 1   Change in appetite 2 1 1 1   Feeling bad or failure about yourself  1 2 3 2   Trouble concentrating 1 1 1 2   Moving slowly or fidgety/restless 1 1 2 1   Suicidal thoughts 0 0 1 1  PHQ-9 Score 9 10 13 12   Difficult doing work/chores Somewhat difficult Somewhat difficult Somewhat difficult Very difficult   GAD-7 Scores:     03/11/2023    3:26 PM 02/18/2023    3:38 PM 02/04/2023    4:05 PM 07/30/2022    2:34 PM  GAD 7 : Generalized Anxiety Score  Nervous, Anxious, on Edge 1 2 1 3   Control/stop worrying 1 2 1 2   Worry too much - different things 1 1 1 3   Trouble relaxing 2 1 1 2   Restless 1 1 1 1   Easily annoyed or irritable 1 2 2 2   Afraid - awful might happen 1 1 2 2   Total GAD 7 Score 8 10 9 15   Anxiety Difficulty Somewhat difficult Very difficult Somewhat difficult Very difficult    Stress Current stressors:  Work conflicts, Brother has throat cancer that could be terminal. Sleep:  Fair Appetite:  Fair Coping ability:  Fair Patient taking medications as prescribed:  Yes. Patient reports  her current medication regimen is managing her symptoms well.   Current medications:  Outpatient Encounter Medications as of 03/11/2023  Medication Sig   amitriptyline (ELAVIL) 25 MG tablet Take by mouth.   buPROPion ER (WELLBUTRIN SR) 100 MG 12 hr tablet Take 1 tablet (100 mg total) by mouth 2 (two) times daily.   clotrimazole-betamethasone (LOTRISONE) cream Apply 1 Application topically 2 (two) times daily.   etonogestrel (IMPLANON) 68 MG IMPL implant Inject 1 each into the skin once.   Multiple Vitamin (MULTI-VITAMIN PO) Take by mouth.   naproxen (NAPROSYN) 500 MG tablet Take by mouth. (Patient not taking: Reported on 02/04/2023)   propranolol (INDERAL) 20 MG tablet Take 1 tablet (20 mg total) by mouth 2 (two) times daily.   No facility-administered encounter medications on file as of 03/11/2023.     Self-harm Behaviors Risk Assessment Self-harm risk factors:  History of depression, prior suicidal thoughts, financial stress Patient endorses recent thoughts of harming self:  NO denies current thoughts,   [place] Suicide Severity Rating Scale:  Danger to Others Risk Assessment Danger to others risk factors:  Patient endorses anger  towards boss for being suspended but denies intent or plan to harm him.  Patient endorses recent thoughts of harming others:  Patient endorses thoughts of harming her boss because she feels like he bullies her but denies plan or intent.    Substance Use Assessment Patient recently consumed alcohol:  None.   Alcohol Use Disorder Identification Test (AUDIT):      No data to display         Patient recently used drugs:  Patient in remission for 3 years.  Opioid Risk Assessment:     03/12/2023   11:13 AM  Opioid Risk   Alcohol 3  Illegal Drugs 2  Rx Drugs 0  Alcohol 3  Illegal Drugs 4  Rx Drugs 5  Age between 16-45 years  1  History of Preadolescent Sexual Abuse 0  Psychological Disease 0  Depression 1  Opioid Risk Tool Scoring 19  Opioid Risk  Interpretation High Risk      Goals, Interventions and Follow-up Plan Goals: Increase healthy adjustment to current life circumstances Interventions: Mindfulness or Relaxation Training and CBT Cognitive Behavioral Therapy Follow-up Plan: Continue with collaborative care.  Summary: [facility] further evaluated patient for collaborative care. 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 She has been clean for 3 years. Relapse potential moderate. Patient has a trauma history but does not endorse PTSD symptoms. Patient presents with anger concerns and wants coping skills to help her manage her anger. Patient reports medication is serving her well. Would benefit from 12-step recovery involvement to build support network.    Reuel Boom

## 2023-03-11 NOTE — Patient Instructions (Addendum)
Study anger worksheet and practice anger management techniques.  Look into 12-step recovery meetings to build a network of recovering people for support.    Monday  [facility] --12:00 noon Open Discussion [place]  - [employer]  [address]. [facility]  - 8:00 pm  Open Speaker  [employer]  [address] [place]

## 2023-03-25 ENCOUNTER — Ambulatory Visit: Payer: Medicaid Other | Admitting: Professional Counselor

## 2023-05-01 ENCOUNTER — Other Ambulatory Visit: Payer: Self-pay | Admitting: Internal Medicine

## 2023-05-01 DIAGNOSIS — F411 Generalized anxiety disorder: Secondary | ICD-10-CM

## 2023-05-20 ENCOUNTER — Ambulatory Visit (HOSPITAL_COMMUNITY)
Admission: RE | Admit: 2023-05-20 | Discharge: 2023-05-20 | Disposition: A | Discharge status: Home / Self Care | Payer: Medicaid Other | Source: Ambulatory Visit | Attending: Internal Medicine | Admitting: Internal Medicine

## 2023-05-20 ENCOUNTER — Ambulatory Visit: Payer: Medicaid Other | Admitting: Internal Medicine

## 2023-05-20 ENCOUNTER — Encounter: Payer: Self-pay | Admitting: Internal Medicine

## 2023-05-20 VITALS — BP 102/70 | HR 90 | Ht 61.0 in | Wt 192.2 lb

## 2023-05-20 DIAGNOSIS — Z0001 Encounter for general adult medical examination with abnormal findings: Secondary | ICD-10-CM

## 2023-05-20 DIAGNOSIS — F411 Generalized anxiety disorder: Secondary | ICD-10-CM | POA: Diagnosis not present

## 2023-05-20 DIAGNOSIS — M25561 Pain in right knee: Secondary | ICD-10-CM

## 2023-05-20 DIAGNOSIS — F331 Major depressive disorder, recurrent, moderate: Secondary | ICD-10-CM | POA: Diagnosis not present

## 2023-05-20 DIAGNOSIS — M79671 Pain in right foot: Secondary | ICD-10-CM | POA: Diagnosis present

## 2023-05-20 DIAGNOSIS — Z720 Tobacco use: Secondary | ICD-10-CM | POA: Diagnosis not present

## 2023-05-20 MED ORDER — MELOXICAM 15 MG PO TABS
15.0000 mg | ORAL_TABLET | Freq: Every day | ORAL | 1 refills | Status: DC
Start: 2023-05-20 — End: 2023-08-16

## 2023-05-20 NOTE — Assessment & Plan Note (Signed)
Right ankle lateral malleolus swelling and pain Check an x-ray of right ankle and foot Meloxicam as needed for pain and swelling Leg elevation advised

## 2023-05-20 NOTE — Assessment & Plan Note (Signed)
Likely due to gastrocnemius/soleus tendinitis Prolonged standing worsening her pain Meloxicam as needed for pain Rest, ice and leg elevation advised

## 2023-05-20 NOTE — Patient Instructions (Signed)
Please take Meloxicam as needed for knee and foot pain.  Okay to apply ice and perform leg elevation.  Please get X-ray of ankle and foot done at [facility].  Please continue to take other medications as prescribed.  Please continue to follow low salt diet and perform moderate exercise/walking at least 150 mins/week.

## 2023-05-20 NOTE — Assessment & Plan Note (Signed)
Physical exam as documented. Fasting blood tests today. Advised to get Shingrix and Tdap vaccines at local pharmacy. Needs PAP smear with Ob.Gyn. - wants to see [facility] in [place]

## 2023-05-20 NOTE — Assessment & Plan Note (Signed)
Smokes about 0.5 pack/day, 1 pack/day X 20 years  Asked about quitting: confirms that he/she currently smokes cigarettes Advise to quit smoking: Educated about QUITTING to reduce the risk of cancer, cardio and cerebrovascular disease. Assess willingness: Unwilling to quit at this time, but is working on cutting back. Assist with counseling and pharmacotherapy: Counseled for 5 minutes and literature provided. On Wellbutrin. Arrange for follow up: follow up in 3 months and continue to offer help.

## 2023-05-20 NOTE — Progress Notes (Signed)
Established Patient Office Visit  Subjective:  Patient ID: Summer Golden, female    DOB: [birth year]  Age: 42 y.o. MRN: [id number]  CC:  Chief Complaint  Patient presents with   Annual Exam    Check legs as well.    HPI Summer Golden is a 42 y.o. female with past medical history of MDD, GAD and tobacco abuse who presents for annual physical.  MDD and GAD: She has history of MDD and has been taking Wellbutrin for it.  She is also taking propranolol for GAD.  She has history of polysubstance abuse and mood disorder due to it.  She has tried Celexa, Prozac and Lexapro.  She has had suicidal ideations with other antidepressants.  She still has apathy, insomnia and feels sluggish.  She denies any suicidal plan currently.  She has good support system currently.  She stated that Wellbutrin has worked the best for her and she is alive because of it.  She was also prescribed Elavil while she was in [place], for insomnia.  She smokes about 0.5 pack/day.  She has been trying to cut down.  Denies any chronic cough, dyspnea or wheezing currently.  She complains of right-sided leg pain, below knee area for the last 1 week.  She has pain on the lateral side of the leg, which is sharp, nonradiating and is worse upon prolonged standing at her workplace and is also associated with leg swelling.  Denies any recent injury or fall. She also reports pain below right ankle lateral malleolus area, which is chronic, worse for the last 1 week.  She has tried taking ibuprofen without much relief.  She states that her ankle pain also worsens with standing.    Past Medical History:  Diagnosis Date   Anemia    Anxiety    Arthritis    Asthma    as child   Chronic chest wall pain    Chronic left shoulder pain    Chronic neck pain    Depressed    GERD (gastroesophageal reflux disease)    History of gout    Murmur    as child   Panic    Suicide attempt (HCC)    1.attempted overdose on narcotics, 2. attempted  to cut self w/ rock 3. attempted to drive car off road.     Past Surgical History:  Procedure Laterality Date   APPENDECTOMY     CARPAL TUNNEL RELEASE Left 06/19/2016   Procedure: LEFT CARPAL TUNNEL RELEASE;  Surgeon: Vickki Hearing, MD;  Location: [facility];  Service: Orthopedics;  Laterality: Left;   MYRINGOTOMY WITH TUBE PLACEMENT Bilateral     Family History  Problem Relation Age of Onset   Heart disease Mother    Diabetes Mother    COPD Father    Cancer Other    Diabetes Other     Social History   Socioeconomic History   Marital status: Legally Separated    Spouse name: Not on file   Number of children: Not on file   Years of education: Not on file   Highest education level: Not on file  Occupational History   Not on file  Tobacco Use   Smoking status: Every Day    Current packs/day: 1.00    Average packs/day: 1 pack/day for 21.0 years (21.0 ttl pk-yrs)    Types: Cigarettes   Smokeless tobacco: Never  Vaping Use   Vaping status: Former  Substance and Sexual Activity   Alcohol  use: No    Comment: history heavy etoh   Drug use: Yes    Types: Marijuana    Comment: MJ daily.   history of pain pills and adderall   Sexual activity: Yes    Birth control/protection: Implant  Other Topics Concern   Not on file  Social History Narrative   Not on file   Social Determinants of Health   Financial Resource Strain: Not on file  Food Insecurity: Not on file  Transportation Needs: Not on file  Physical Activity: Not on file  Stress: Not on file  Social Connections: Not on file  Intimate Partner Violence: Not on file    Outpatient Medications Prior to Visit  Medication Sig Dispense Refill   amitriptyline (ELAVIL) 25 MG tablet Take by mouth.     buPROPion ER (WELLBUTRIN SR) 100 MG 12 hr tablet Take 1 tablet (100 mg total) by mouth 2 (two) times daily. 180 tablet 1   clotrimazole-betamethasone (LOTRISONE) cream Apply 1 Application topically 2 (two) times daily. 45  g 0   etonogestrel (IMPLANON) 68 MG IMPL implant Inject 1 each into the skin once.     Multiple Vitamin (MULTI-VITAMIN PO) Take by mouth.     propranolol (INDERAL) 20 MG tablet TAKE 1 TABLET(20 MG) BY MOUTH TWICE DAILY 180 tablet 0   naproxen (NAPROSYN) 500 MG tablet Take by mouth. (Patient not taking: Reported on 02/04/2023)     No facility-administered medications prior to visit.    Allergies  Allergen Reactions   Morphine Other (See Comments)    Makes her feel like she is drowning   Serotonin Reuptake Inhibitors (Ssris)     Prozac Lexapro Celexa -these all make her suicidal     ROS Review of Systems  Constitutional:  Negative for chills and fever.  HENT:  Negative for congestion, sinus pressure, sinus pain and sore throat.   Eyes:  Negative for pain and discharge.  Respiratory:  Negative for cough and shortness of breath.   Cardiovascular:  Positive for leg swelling. Negative for chest pain and palpitations.  Gastrointestinal:  Negative for abdominal pain, constipation, diarrhea, nausea and vomiting.  Endocrine: Negative for polydipsia and polyuria.  Genitourinary:  Negative for dysuria and hematuria.  Musculoskeletal:  Negative for neck pain and neck stiffness.       Right leg and ankle pain  Skin:  Negative for rash.  Neurological:  Negative for dizziness and weakness.  Psychiatric/Behavioral:  Positive for decreased concentration, dysphoric mood and sleep disturbance. Negative for agitation and behavioral problems. The patient is nervous/anxious.       Objective:    Physical Exam Vitals reviewed.  Constitutional:      General: She is not in acute distress.    Appearance: She is not diaphoretic.  HENT:     Head: Normocephalic and atraumatic.     Nose: Nose normal.     Mouth/Throat:     Mouth: Mucous membranes are moist.  Eyes:     General: No scleral icterus.    Extraocular Movements: Extraocular movements intact.  Cardiovascular:     Rate and Rhythm: Normal  rate and regular rhythm.     Heart sounds: Normal heart sounds. No murmur heard. Pulmonary:     Breath sounds: Normal breath sounds. No wheezing or rales.  Abdominal:     Palpations: Abdomen is soft.     Tenderness: There is no abdominal tenderness.  Musculoskeletal:     Cervical back: Neck supple. No tenderness.  Right lower leg: No edema.     Left lower leg: No edema.     Right ankle: Swelling present. Tenderness present over the lateral malleolus.     Comments: Tenderness over gastrocnemius/soleus tendon area  Skin:    General: Skin is warm.     Findings: No rash.  Neurological:     General: No focal deficit present.     Mental Status: She is alert and oriented to person, place, and time.     Sensory: No sensory deficit.     Motor: No weakness.  Psychiatric:        Mood and Affect: Mood is depressed.        Behavior: Behavior normal.     BP 102/70   Pulse 90   Ht 5\' 1"  (1.549 m)   Wt 192 lb 3.2 oz (87.2 kg)   SpO2 97%   BMI 36.32 kg/m  Wt Readings from Last 3 Encounters:  05/20/23 192 lb 3.2 oz (87.2 kg)  02/04/23 187 lb 9.6 oz (85.1 kg)  10/02/22 183 lb (83 kg)    Lab Results  Component Value Date   TSH 1.019 08/03/2022   Lab Results  Component Value Date   WBC 6.4 06/17/2016   HGB 14.2 06/17/2016   HCT 41.5 06/17/2016   MCV 94.7 06/17/2016   PLT 205 06/17/2016   Lab Results  Component Value Date   NA 139 08/03/2022   K 4.1 08/03/2022   CO2 24 08/03/2022   GLUCOSE 86 08/03/2022   BUN 11 08/03/2022   CREATININE 0.68 08/03/2022   BILITOT 0.7 08/03/2022   ALKPHOS 99 08/03/2022   AST 24 08/03/2022   ALT 25 08/03/2022   PROT 7.1 08/03/2022   ALBUMIN 3.8 08/03/2022   CALCIUM 8.7 (L) 08/03/2022   ANIONGAP 9 08/03/2022   Lab Results  Component Value Date   CHOL 200 08/03/2022   Lab Results  Component Value Date   HDL 52 08/03/2022   Lab Results  Component Value Date   LDLCALC 135 (H) 08/03/2022   Lab Results  Component Value Date    TRIG 63 08/03/2022   Lab Results  Component Value Date   CHOLHDL 3.8 08/03/2022   Lab Results  Component Value Date   HGBA1C 5.1 08/03/2022      Assessment & Plan:   Problem List Items Addressed This Visit       Other   Moderate episode of recurrent major depressive disorder (HCC)    Uncontrolled, but has been better with Wellbutrin 100 mg twice daily Since she has not tolerated other SSRIs and has responded the best with Wellbutrin, would keep same regimen for now Referred to [facility] therapy      GAD (generalized anxiety disorder)    Overall controlled with propranolol Has episodes of anxiety and insomnia, takes Elavil as needed      Tobacco abuse    Smokes about 0.5 pack/day, 1 pack/day X 20 years  Asked about quitting: confirms that he/she currently smokes cigarettes Advise to quit smoking: Educated about QUITTING to reduce the risk of cancer, cardio and cerebrovascular disease. Assess willingness: Unwilling to quit at this time, but is working on cutting back. Assist with counseling and pharmacotherapy: Counseled for 5 minutes and literature provided. On Wellbutrin. Arrange for follow up: follow up in 3 months and continue to offer help.      Encounter for general adult medical examination with abnormal findings - Primary    Physical exam as documented. Fasting  blood tests today. Advised to get Shingrix and Tdap vaccines at local pharmacy. Needs PAP smear with Ob.Gyn. - wants to see [facility] in [place]      Acute pain of right knee    Likely due to gastrocnemius/soleus tendinitis Prolonged standing worsening her pain Meloxicam as needed for pain Rest, ice and leg elevation advised       Right foot pain    Right ankle lateral malleolus swelling and pain Check an x-ray of right ankle and foot Meloxicam as needed for pain and swelling Leg elevation advised      Relevant Medications   meloxicam (MOBIC) 15 MG tablet   Other Relevant Orders   DG Ankle Complete  Right   DG Foot Complete Right    Meds ordered this encounter  Medications   meloxicam (MOBIC) 15 MG tablet    Sig: Take 1 tablet (15 mg total) by mouth daily.    Dispense:  30 tablet    Refill:  1    Follow-up: Return in about 4 months (around 09/20/2023) for GAD.    Anabel Halon, MD

## 2023-05-20 NOTE — Assessment & Plan Note (Signed)
Uncontrolled, but has been better with Wellbutrin 100 mg twice daily Since she has not tolerated other SSRIs and has responded the best with Wellbutrin, would keep same regimen for now Referred to [facility] therapy

## 2023-05-20 NOTE — Assessment & Plan Note (Signed)
Overall controlled with propranolol Has episodes of anxiety and insomnia, takes Elavil as needed

## 2023-05-21 LAB — CBC WITH DIFFERENTIAL/PLATELET

## 2023-05-21 LAB — HEPATITIS C ANTIBODY: Hep C Virus Ab: NONREACTIVE

## 2023-05-21 LAB — LIPID PANEL
Cholesterol, Total: 200 mg/dL — ABNORMAL HIGH (ref 100–199)
HDL: 45 mg/dL (ref >39)
LDL Chol Calc (NIH): 134 mg/dL — ABNORMAL HIGH (ref 0–99)
Triglycerides: 116 mg/dL (ref 0–149)
VLDL Cholesterol Cal: 21 mg/dL (ref 5–40)

## 2023-05-21 LAB — CMP14+EGFR
Albumin: 4.1 g/dL (ref 3.9–4.9)
Alkaline Phosphatase: 121 IU/L (ref 44–121)
BUN/Creatinine Ratio: 14 (ref 9–23)
Bilirubin Total: <0.2 mg/dL (ref 0.0–1.2)
Calcium: 9 mg/dL (ref 8.7–10.2)
Glucose: 112 mg/dL — ABNORMAL HIGH (ref 70–99)
Sodium: 137 mmol/L (ref 134–144)

## 2023-05-21 LAB — TSH: TSH: 1.02 u[IU]/mL (ref 0.450–4.500)

## 2023-05-22 LAB — VITAMIN D 25 HYDROXY (VIT D DEFICIENCY, FRACTURES): Vit D, 25-Hydroxy: 28.8 ng/mL — ABNORMAL LOW (ref 30.0–100.0)

## 2023-05-22 LAB — CMP14+EGFR
ALT: 15 IU/L (ref 0–32)
AST: 18 IU/L (ref 0–40)
BUN: 11 mg/dL (ref 6–24)
CO2: 20 mmol/L (ref 20–29)
Chloride: 102 mmol/L (ref 96–106)
Creatinine, Ser: 0.8 mg/dL (ref 0.57–1.00)
Globulin, Total: 2.2 g/dL (ref 1.5–4.5)
Potassium: 3.9 mmol/L (ref 3.5–5.2)
Total Protein: 6.3 g/dL (ref 6.0–8.5)
eGFR: 93 mL/min/{1.73_m2} (ref >59)

## 2023-05-22 LAB — CBC WITH DIFFERENTIAL/PLATELET
Basophils Absolute: 0.1 10*3/uL (ref 0.0–0.2)
EOS (ABSOLUTE): 0.1 10*3/uL (ref 0.0–0.4)
Eos: 2 %
Hematocrit: 43.7 % (ref 34.0–46.6)
Immature Granulocytes: 0 %
Lymphs: 48 %
MCH: 32.2 pg (ref 26.6–33.0)
Monocytes Absolute: 0.4 10*3/uL (ref 0.1–0.9)
Monocytes: 6 %
Neutrophils: 43 %

## 2023-05-22 LAB — HEMOGLOBIN A1C
Est. average glucose Bld gHb Est-mCnc: 111 mg/dL
Hgb A1c MFr Bld: 5.5 % (ref 4.8–5.6)

## 2023-05-22 LAB — LIPID PANEL: Chol/HDL Ratio: 4.4 ratio (ref 0.0–4.4)

## 2023-05-22 LAB — HIV ANTIBODY (ROUTINE TESTING W REFLEX): HIV Screen 4th Generation wRfx: NONREACTIVE

## 2023-06-04 ENCOUNTER — Telehealth: Payer: Self-pay | Admitting: Professional Counselor

## 2023-06-04 NOTE — Telephone Encounter (Signed)
[facility] reached out to patient to determine continued interest in collaborative care. Patient declined the need for services as her situation has improved significantly. We will sign off on this patient.

## 2023-08-14 ENCOUNTER — Other Ambulatory Visit: Payer: Self-pay | Admitting: Internal Medicine

## 2023-08-14 DIAGNOSIS — M79671 Pain in right foot: Secondary | ICD-10-CM

## 2023-08-20 ENCOUNTER — Other Ambulatory Visit: Payer: Self-pay | Admitting: Internal Medicine

## 2023-08-20 DIAGNOSIS — F331 Major depressive disorder, recurrent, moderate: Secondary | ICD-10-CM

## 2023-08-22 ENCOUNTER — Other Ambulatory Visit: Payer: Self-pay | Admitting: Internal Medicine

## 2023-08-22 DIAGNOSIS — F411 Generalized anxiety disorder: Secondary | ICD-10-CM

## 2023-09-20 ENCOUNTER — Ambulatory Visit: Payer: Medicaid Other | Admitting: Internal Medicine

## 2023-09-22 ENCOUNTER — Ambulatory Visit (INDEPENDENT_AMBULATORY_CARE_PROVIDER_SITE_OTHER): Payer: BC Managed Care – PPO | Admitting: Internal Medicine

## 2023-09-22 ENCOUNTER — Encounter: Payer: Self-pay | Admitting: Internal Medicine

## 2023-09-22 VITALS — BP 101/49 | HR 89 | Ht 61.0 in | Wt 207.8 lb

## 2023-09-22 DIAGNOSIS — F411 Generalized anxiety disorder: Secondary | ICD-10-CM

## 2023-09-22 DIAGNOSIS — M79671 Pain in right foot: Secondary | ICD-10-CM

## 2023-09-22 DIAGNOSIS — E782 Mixed hyperlipidemia: Secondary | ICD-10-CM | POA: Diagnosis not present

## 2023-09-22 DIAGNOSIS — F331 Major depressive disorder, recurrent, moderate: Secondary | ICD-10-CM

## 2023-09-22 DIAGNOSIS — Z720 Tobacco use: Secondary | ICD-10-CM

## 2023-09-22 DIAGNOSIS — G609 Hereditary and idiopathic neuropathy, unspecified: Secondary | ICD-10-CM

## 2023-09-22 MED ORDER — GABAPENTIN 100 MG PO CAPS
100.0000 mg | ORAL_CAPSULE | Freq: Every day | ORAL | 3 refills | Status: DC
Start: 2023-09-22 — End: 2024-01-20

## 2023-09-22 NOTE — Progress Notes (Signed)
Established Patient Office Visit  Subjective:  Patient ID: Summer Golden, female    DOB: 11-08-1980  Age: 42 y.o. MRN: [id number]  CC:  Chief Complaint  Patient presents with   Anxiety    Four month follow up    Numbness    Numbness on bottom of feet and in toes     HPI Summer Golden is a 42 y.o. female with past medical history of MDD, GAD and tobacco abuse who presents for f/u of her chronic medical conditions.  MDD and GAD: She has history of MDD and has been taking Wellbutrin for it.  She is also taking propranolol for GAD.  She has history of polysubstance abuse and mood disorder due to it.  She has tried Celexa, Prozac and Lexapro.  She has had suicidal ideations with other antidepressants.  She still has apathy, insomnia and feels sluggish, but has felt better recently.  She denies any suicidal plan currently.  She has good support system currently.  She stated that Wellbutrin has worked the best for her and she is alive because of it.  She was also prescribed Elavil while she was in [place], for insomnia.  She smokes about 0.5 pack/day.  She has been trying to cut down.  Denies any chronic cough, dyspnea or wheezing currently.   She reports numbness of bilateral feet, worse around lateral side of the right foot.  She feels loosening of the fifth digit at times.  She also reports pain below right ankle lateral malleolus area, which is chronic.  She has been taking meloxicam with some relief.  She states that her ankle pain also worsens with standing.  Past Medical History:  Diagnosis Date   Anemia    Anxiety    Arthritis    Asthma    as child   Chronic chest wall pain    Chronic left shoulder pain    Chronic neck pain    Depressed    GERD (gastroesophageal reflux disease)    History of gout    Murmur    as child   Panic    Suicide attempt (HCC)    1.attempted overdose on narcotics, 2. attempted to cut self w/ rock 3. attempted to drive car off road.     Past  Surgical History:  Procedure Laterality Date   APPENDECTOMY     CARPAL TUNNEL RELEASE Left 06/19/2016   Procedure: LEFT CARPAL TUNNEL RELEASE;  Surgeon: Vickki Hearing, MD;  Location: [facility];  Service: Orthopedics;  Laterality: Left;   MYRINGOTOMY WITH TUBE PLACEMENT Bilateral     Family History  Problem Relation Age of Onset   Heart disease Mother    Diabetes Mother    COPD Father    Cancer Other    Diabetes Other     Social History   Socioeconomic History   Marital status: Legally Separated    Spouse name: Not on file   Number of children: Not on file   Years of education: Not on file   Highest education level: Not on file  Occupational History   Not on file  Tobacco Use   Smoking status: Every Day    Current packs/day: 1.00    Average packs/day: 1 pack/day for 21.0 years (21.0 ttl pk-yrs)    Types: Cigarettes   Smokeless tobacco: Never  Vaping Use   Vaping status: Former  Substance and Sexual Activity   Alcohol use: No    Comment: history heavy etoh  Drug use: Yes    Types: Marijuana    Comment: MJ daily.   history of pain pills and adderall   Sexual activity: Yes    Birth control/protection: Implant  Other Topics Concern   Not on file  Social History Narrative   Not on file   Social Determinants of Health   Financial Resource Strain: Not on file  Food Insecurity: Not on file  Transportation Needs: Not on file  Physical Activity: Not on file  Stress: Not on file  Social Connections: Not on file  Intimate Partner Violence: Not on file    Outpatient Medications Prior to Visit  Medication Sig Dispense Refill   amitriptyline (ELAVIL) 25 MG tablet Take by mouth.     buPROPion ER (WELLBUTRIN SR) 100 MG 12 hr tablet TAKE 1 TABLET(100 MG) BY MOUTH TWICE DAILY 180 tablet 1   clotrimazole-betamethasone (LOTRISONE) cream Apply 1 Application topically 2 (two) times daily. 45 g 0   etonogestrel (IMPLANON) 68 MG IMPL implant Inject 1 each into the skin once.      meloxicam (MOBIC) 15 MG tablet TAKE 1 TABLET(15 MG) BY MOUTH DAILY 30 tablet 1   Multiple Vitamin (MULTI-VITAMIN PO) Take by mouth.     propranolol (INDERAL) 20 MG tablet TAKE 1 TABLET(20 MG) BY MOUTH TWICE DAILY 180 tablet 0   No facility-administered medications prior to visit.    Allergies  Allergen Reactions   Morphine Other (See Comments)    Makes her feel like she is drowning   Serotonin Reuptake Inhibitors (Ssris)     Prozac Lexapro Celexa -these all make her suicidal     ROS Review of Systems  Constitutional:  Negative for chills and fever.  HENT:  Negative for congestion, sinus pressure, sinus pain and sore throat.   Eyes:  Negative for pain and discharge.  Respiratory:  Negative for cough and shortness of breath.   Cardiovascular:  Positive for leg swelling. Negative for chest pain and palpitations.  Gastrointestinal:  Negative for abdominal pain, diarrhea, nausea and vomiting.  Endocrine: Negative for polydipsia and polyuria.  Genitourinary:  Negative for dysuria and hematuria.  Musculoskeletal:  Negative for neck pain and neck stiffness.       Right leg and ankle pain  Skin:  Negative for rash.  Neurological:  Positive for numbness (R > L foot). Negative for dizziness and weakness.  Psychiatric/Behavioral:  Positive for decreased concentration, dysphoric mood and sleep disturbance. Negative for agitation and behavioral problems. The patient is nervous/anxious.       Objective:    Physical Exam Vitals reviewed.  Constitutional:      General: She is not in acute distress.    Appearance: She is not diaphoretic.  HENT:     Head: Normocephalic and atraumatic.     Nose: Nose normal.     Mouth/Throat:     Mouth: Mucous membranes are moist.  Eyes:     General: No scleral icterus.    Extraocular Movements: Extraocular movements intact.  Cardiovascular:     Rate and Rhythm: Normal rate and regular rhythm.     Heart sounds: Normal heart sounds. No murmur  heard.    Comments: DPA pulse intact bilaterally Pulmonary:     Breath sounds: Normal breath sounds. No wheezing or rales.  Musculoskeletal:     Cervical back: Neck supple. No tenderness.     Right lower leg: No edema.     Left lower leg: No edema.     Right ankle: Swelling  present. Tenderness present over the lateral malleolus.     Comments: Tenderness over gastrocnemius/soleus tendon area  Skin:    General: Skin is warm.     Findings: No rash.  Neurological:     General: No focal deficit present.     Mental Status: She is alert and oriented to person, place, and time.     Sensory: No sensory deficit.     Motor: No weakness.  Psychiatric:        Mood and Affect: Mood is depressed.        Behavior: Behavior normal.     BP (!) 101/49 (BP Location: Left Arm, Patient Position: Sitting, Cuff Size: Large)   Pulse 89   Ht 5\' 1"  (1.549 m)   Wt 207 lb 12.8 oz (94.3 kg)   SpO2 96%   BMI 39.26 kg/m  Wt Readings from Last 3 Encounters:  09/22/23 207 lb 12.8 oz (94.3 kg)  05/20/23 192 lb 3.2 oz (87.2 kg)  02/04/23 187 lb 9.6 oz (85.1 kg)    Lab Results  Component Value Date   TSH 1.020 05/20/2023   Lab Results  Component Value Date   WBC 7.3 05/20/2023   HGB 14.7 05/20/2023   HCT 43.7 05/20/2023   MCV 96 05/20/2023   PLT 262 05/20/2023   Lab Results  Component Value Date   NA 137 05/20/2023   K 3.9 05/20/2023   CO2 20 05/20/2023   GLUCOSE 112 (H) 05/20/2023   BUN 11 05/20/2023   CREATININE 0.80 05/20/2023   BILITOT <0.2 05/20/2023   ALKPHOS 121 05/20/2023   AST 18 05/20/2023   ALT 15 05/20/2023   PROT 6.3 05/20/2023   ALBUMIN 4.1 05/20/2023   CALCIUM 9.0 05/20/2023   ANIONGAP 9 08/03/2022   EGFR 93 05/20/2023   Lab Results  Component Value Date   CHOL 200 (H) 05/20/2023   Lab Results  Component Value Date   HDL 45 05/20/2023   Lab Results  Component Value Date   LDLCALC 134 (H) 05/20/2023   Lab Results  Component Value Date   TRIG 116 05/20/2023    Lab Results  Component Value Date   CHOLHDL 4.4 05/20/2023   Lab Results  Component Value Date   HGBA1C 5.5 05/20/2023      Assessment & Plan:   Problem List Items Addressed This Visit       Nervous and Auditory   Idiopathic peripheral neuropathy    Numbness in bilateral feet could be due to peripheral neuropathy Started gabapentin 100 mg nightly, can increase dose if tolerated Offered PM&R referral for NCS, but she denies for now      Relevant Medications   gabapentin (NEURONTIN) 100 MG capsule     Other   Moderate episode of recurrent major depressive disorder (HCC) - Primary    Uncontrolled, but has been better with Wellbutrin 100 mg twice daily Since she has not tolerated other SSRIs and has responded the best with Wellbutrin, would keep same regimen for now If persistent or worse, can consider Vraylar Referred to [facility] therapy      GAD (generalized anxiety disorder)    Overall controlled with propranolol Has episodes of anxiety and insomnia, takes Elavil as needed      Mixed hyperlipidemia    Advised to follow low cholesterol diet for now      Tobacco abuse    Smokes about 0.5 pack/day, 1 pack/day X 20 years  Asked about quitting: confirms that he/she currently smokes  cigarettes Advise to quit smoking: Educated about QUITTING to reduce the risk of cancer, cardio and cerebrovascular disease. Assess willingness: Unwilling to quit at this time, but is working on cutting back. Assist with counseling and pharmacotherapy: Counseled for 5 minutes and literature provided. On Wellbutrin. Arrange for follow up: follow up in 3 months and continue to offer help.      Right foot pain    Right ankle lateral malleolus swelling and pain Checked x-ray of right ankle and foot - has calcaneal heel spur and healing fifth toe proximal phalanx fracture Due to persistent pain, referred to Podiatry Meloxicam as needed for pain and swelling Leg elevation advised      Relevant  Orders   Ambulatory referral to Podiatry    Meds ordered this encounter  Medications   gabapentin (NEURONTIN) 100 MG capsule    Sig: Take 1 capsule (100 mg total) by mouth at bedtime.    Dispense:  30 capsule    Refill:  3    Follow-up: Return in about 4 months (around 01/20/2024) for GAD and numbness of foot.    Anabel Halon, MD

## 2023-09-22 NOTE — Assessment & Plan Note (Signed)
Smokes about 0.5 pack/day, 1 pack/day X 20 years  Asked about quitting: confirms that he/she currently smokes cigarettes Advise to quit smoking: Educated about QUITTING to reduce the risk of cancer, cardio and cerebrovascular disease. Assess willingness: Unwilling to quit at this time, but is working on cutting back. Assist with counseling and pharmacotherapy: Counseled for 5 minutes and literature provided. On Wellbutrin. Arrange for follow up: follow up in 3 months and continue to offer help.

## 2023-09-22 NOTE — Assessment & Plan Note (Signed)
Advised to follow low cholesterol diet for now

## 2023-09-22 NOTE — Assessment & Plan Note (Signed)
Right ankle lateral malleolus swelling and pain Checked x-ray of right ankle and foot - has calcaneal heel spur and healing fifth toe proximal phalanx fracture Due to persistent pain, referred to Podiatry Meloxicam as needed for pain and swelling Leg elevation advised

## 2023-09-22 NOTE — Assessment & Plan Note (Addendum)
Uncontrolled, but has been better with Wellbutrin 100 mg twice daily Since she has not tolerated other SSRIs and has responded the best with Wellbutrin, would keep same regimen for now If persistent or worse, can consider Vraylar Referred to [facility] therapy

## 2023-09-22 NOTE — Assessment & Plan Note (Signed)
Overall controlled with propranolol Has episodes of anxiety and insomnia, takes Elavil as needed

## 2023-09-22 NOTE — Assessment & Plan Note (Signed)
Numbness in bilateral feet could be due to peripheral neuropathy Started gabapentin 100 mg nightly, can increase dose if tolerated Offered PM&R referral for NCS, but she denies for now

## 2023-09-22 NOTE — Patient Instructions (Signed)
Please start taking Gabapentin as prescribed.  Please continue to take medications as prescribed.  Please continue to follow low carb diet and perform moderate exercise/walking at least 150 mins/week.  Please cut down -> quit smoking.

## 2023-10-06 ENCOUNTER — Ambulatory Visit (INDEPENDENT_AMBULATORY_CARE_PROVIDER_SITE_OTHER): Payer: Medicaid Other

## 2023-10-06 ENCOUNTER — Ambulatory Visit: Payer: Medicaid Other | Admitting: Podiatry

## 2023-10-06 ENCOUNTER — Encounter: Payer: Self-pay | Admitting: Podiatry

## 2023-10-06 DIAGNOSIS — M7751 Other enthesopathy of right foot: Secondary | ICD-10-CM

## 2023-10-06 DIAGNOSIS — M778 Other enthesopathies, not elsewhere classified: Secondary | ICD-10-CM

## 2023-10-06 DIAGNOSIS — M779 Enthesopathy, unspecified: Secondary | ICD-10-CM

## 2023-10-06 MED ORDER — TRIAMCINOLONE ACETONIDE 10 MG/ML IJ SUSP
10.0000 mg | Freq: Once | INTRAMUSCULAR | Status: AC
Start: 2023-10-06 — End: 2023-10-06
  Administered 2023-10-06: 10 mg via INTRA_ARTICULAR

## 2023-10-10 ENCOUNTER — Other Ambulatory Visit: Payer: Self-pay | Admitting: Internal Medicine

## 2023-10-10 DIAGNOSIS — M79671 Pain in right foot: Secondary | ICD-10-CM

## 2023-10-10 NOTE — Progress Notes (Signed)
Subjective:   Patient ID: Summer Golden, female   DOB: 42 y.o.   MRN: [id number]   HPI Patient presents stating she is getting a lot of pain on the outside of her right foot and some numbness that she was concerned about and wanted to check.  Patient smokes 1 pack/day and is not overly active   Review of Systems  All other systems reviewed and are negative.       Objective:  Physical Exam Vitals and nursing note reviewed.  Constitutional:      Appearance: She is well-developed.  Pulmonary:     Effort: Pulmonary effort is normal.  Musculoskeletal:        General: Normal range of motion.  Skin:    General: Skin is warm.  Neurological:     Mental Status: She is alert.     Neurovascular status was found to be intact muscle strength is within normal limits with inflammation around the fifth MPJ and slightly proximal to this point with no loss of muscle function or tendon function.  I did not note a diminishment of sharp dull vibratory currently     Assessment:  Possibility of tendinitis like inflammation numbness I am hoping is due to just stress but cannot rule out any other pathology     Plan:  H&P reviewed and I went ahead today I did sterile prep and I injected the base of the fifth metatarsal after explaining risk 3 mg dexamethasone Kenalog 5 mg Xylocaine I advised on ice therapy I advised on reduced activity support shoes and reappoint as needed  X-rays were negative for signs of fracture or bony injury

## 2023-11-21 ENCOUNTER — Other Ambulatory Visit: Payer: Self-pay | Admitting: Internal Medicine

## 2023-11-21 DIAGNOSIS — F411 Generalized anxiety disorder: Secondary | ICD-10-CM

## 2024-01-20 ENCOUNTER — Encounter: Payer: Self-pay | Admitting: Internal Medicine

## 2024-01-20 ENCOUNTER — Ambulatory Visit: Payer: Medicaid Other | Admitting: Internal Medicine

## 2024-01-20 VITALS — BP 101/68 | HR 85 | Ht 61.0 in | Wt 204.6 lb

## 2024-01-20 DIAGNOSIS — G609 Hereditary and idiopathic neuropathy, unspecified: Secondary | ICD-10-CM | POA: Diagnosis not present

## 2024-01-20 DIAGNOSIS — Z1211 Encounter for screening for malignant neoplasm of colon: Secondary | ICD-10-CM | POA: Diagnosis not present

## 2024-01-20 DIAGNOSIS — F411 Generalized anxiety disorder: Secondary | ICD-10-CM | POA: Diagnosis not present

## 2024-01-20 DIAGNOSIS — Z720 Tobacco use: Secondary | ICD-10-CM

## 2024-01-20 DIAGNOSIS — F331 Major depressive disorder, recurrent, moderate: Secondary | ICD-10-CM

## 2024-01-20 DIAGNOSIS — B354 Tinea corporis: Secondary | ICD-10-CM

## 2024-01-20 MED ORDER — PROPRANOLOL HCL 20 MG PO TABS: 20.0000 mg | ORAL_TABLET | Freq: Two times a day (BID) | ORAL | 1 refills | Status: AC

## 2024-01-20 MED ORDER — GABAPENTIN 100 MG PO CAPS
100.0000 mg | ORAL_CAPSULE | Freq: Every day | ORAL | 3 refills | Status: DC
Start: 2024-01-20 — End: 2024-05-23

## 2024-01-20 MED ORDER — BUPROPION HCL ER (SR) 100 MG PO TB12: 100.0000 mg | ORAL_TABLET | Freq: Two times a day (BID) | ORAL | 1 refills | Status: AC

## 2024-01-20 MED ORDER — CARIPRAZINE HCL 1.5 MG PO CAPS: 1.5000 mg | ORAL_CAPSULE | Freq: Every day | ORAL | 3 refills | Status: DC

## 2024-01-20 MED ORDER — CLOTRIMAZOLE-BETAMETHASONE 1-0.05 % EX CREA
1.0000 | TOPICAL_CREAM | Freq: Two times a day (BID) | CUTANEOUS | 0 refills | Status: AC
Start: 2024-01-20 — End: ?

## 2024-01-20 NOTE — Assessment & Plan Note (Addendum)
 Overall controlled with propranolol Has episodes of anxiety and insomnia, used to take Elavil as needed, has not taken it recently

## 2024-01-20 NOTE — Assessment & Plan Note (Addendum)
 Numbness in bilateral feet could be due to peripheral neuropathy Continue gabapentin 100 mg nightly for now due to improvement in symptoms Offered PM&R referral for NCS, but she denies for now

## 2024-01-20 NOTE — Assessment & Plan Note (Signed)
 Uncontrolled with Wellbutrin 100 mg twice daily Since she has not tolerated other SSRIs and has responded the best with Wellbutrin, would avoid adding SSRI Due to persistent symptoms of anhedonia and agitation, added Vraylar 1.5 mg QD Has been referred to [facility] therapy

## 2024-01-20 NOTE — Patient Instructions (Signed)
 Please continue to take medications as prescribed.  Please continue to follow low carb diet and perform moderate exercise/walking at least 150 mins/week.

## 2024-01-20 NOTE — Assessment & Plan Note (Signed)
 Smokes about 0.5 pack/day, h/o 1 pack/day X 20 years  Asked about quitting: confirms that he/she currently smokes cigarettes Advise to quit smoking: Educated about QUITTING to reduce the risk of cancer, cardio and cerebrovascular disease. Assess willingness: Unwilling to quit at this time, but is working on cutting back. Assist with counseling and pharmacotherapy: Counseled for 5 minutes and literature provided. On Wellbutrin. Arrange for follow up: follow up in 3 months and continue to offer help.

## 2024-01-20 NOTE — Progress Notes (Signed)
 Established Patient Office Visit  Subjective:  Patient ID: Summer Golden, female    DOB: 09/27/1981  Age: 43 y.o. MRN: [id number]  CC:  Chief Complaint  Patient presents with   Care Management    4 month f/u    HPI Summer Golden is a 43 y.o. female with past medical history of MDD, GAD and tobacco abuse who presents for f/u of her chronic medical conditions.  MDD and GAD: She has history of MDD and has been taking Wellbutrin for it.  She is also taking propranolol for GAD.  She has history of polysubstance abuse and mood disorder due to it, but has quit illicit substance use.  She has tried Celexa, Prozac and Lexapro.  She has had suicidal ideations with other antidepressants.  She still has apathy, insomnia and feels sluggish at times. She gets irritated with mild triggers.  She denies any suicidal plan currently.  She has good support system currently.  She stated that Wellbutrin has worked the best for her and she is alive because of it.  She was also prescribed Elavil while she was in [place], for insomnia, but has not taken it recently.  She smokes about 0.5 pack/day.  She has been trying to cut down.  Denies any chronic cough, dyspnea or wheezing currently.   She has numbness of bilateral feet, worse around lateral side of the right foot.  She feels loosening of the fifth digit at times.  She also reports pain below right ankle lateral malleolus area, which is chronic.  She has been taking meloxicam with some relief.  She states that her ankle pain also worsens with standing. She has felt better with Gabapentin.  Past Medical History:  Diagnosis Date   Anemia    Anxiety    Arthritis    Asthma    as child   Chronic chest wall pain    Chronic left shoulder pain    Chronic neck pain    Depressed    GERD (gastroesophageal reflux disease)    History of gout    Murmur    as child   Panic    Suicide attempt (HCC)    1.attempted overdose on narcotics, 2. attempted to cut self  w/ rock 3. attempted to drive car off road.     Past Surgical History:  Procedure Laterality Date   APPENDECTOMY     CARPAL TUNNEL RELEASE Left 06/19/2016   Procedure: LEFT CARPAL TUNNEL RELEASE;  Surgeon: Vickki Hearing, MD;  Location: [facility];  Service: Orthopedics;  Laterality: Left;   MYRINGOTOMY WITH TUBE PLACEMENT Bilateral     Family History  Problem Relation Age of Onset   Heart disease Mother    Diabetes Mother    COPD Father    Cancer Other    Diabetes Other     Social History   Socioeconomic History   Marital status: Legally Separated    Spouse name: Not on file   Number of children: Not on file   Years of education: Not on file   Highest education level: Not on file  Occupational History   Not on file  Tobacco Use   Smoking status: Every Day    Current packs/day: 1.00    Average packs/day: 1 pack/day for 21.0 years (21.0 ttl pk-yrs)    Types: Cigarettes   Smokeless tobacco: Never  Vaping Use   Vaping status: Former  Substance and Sexual Activity   Alcohol use: No  Comment: history heavy etoh   Drug use: Yes    Types: Marijuana    Comment: MJ daily.   history of pain pills and adderall   Sexual activity: Yes    Birth control/protection: Implant  Other Topics Concern   Not on file  Social History Narrative   Not on file   Social Drivers of Health   Financial Resource Strain: Not on file  Food Insecurity: Not on file  Transportation Needs: Not on file  Physical Activity: Not on file  Stress: Not on file  Social Connections: Not on file  Intimate Partner Violence: Not on file    Outpatient Medications Prior to Visit  Medication Sig Dispense Refill   amitriptyline (ELAVIL) 25 MG tablet Take by mouth.     etonogestrel (IMPLANON) 68 MG IMPL implant Inject 1 each into the skin once.     Multiple Vitamin (MULTI-VITAMIN PO) Take by mouth.     buPROPion ER (WELLBUTRIN SR) 100 MG 12 hr tablet TAKE 1 TABLET(100 MG) BY MOUTH TWICE DAILY 180 tablet  1   clotrimazole-betamethasone (LOTRISONE) cream Apply 1 Application topically 2 (two) times daily. 45 g 0   gabapentin (NEURONTIN) 100 MG capsule Take 1 capsule (100 mg total) by mouth at bedtime. 30 capsule 3   meloxicam (MOBIC) 15 MG tablet TAKE 1 TABLET(15 MG) BY MOUTH DAILY 30 tablet 1   propranolol (INDERAL) 20 MG tablet TAKE 1 TABLET(20 MG) BY MOUTH TWICE DAILY 180 tablet 0   No facility-administered medications prior to visit.    Allergies  Allergen Reactions   Morphine Other (See Comments)    Makes her feel like she is drowning   Serotonin Reuptake Inhibitors (Ssris)     Prozac Lexapro Celexa -these all make her suicidal     ROS Review of Systems  Constitutional:  Negative for chills and fever.  HENT:  Negative for congestion, sinus pressure, sinus pain and sore throat.   Eyes:  Negative for pain and discharge.  Respiratory:  Negative for cough and shortness of breath.   Cardiovascular:  Positive for leg swelling. Negative for chest pain and palpitations.  Gastrointestinal:  Negative for abdominal pain, diarrhea, nausea and vomiting.  Endocrine: Negative for polydipsia and polyuria.  Genitourinary:  Negative for dysuria and hematuria.  Musculoskeletal:  Negative for neck pain and neck stiffness.       Right leg and ankle pain  Skin:  Positive for rash.  Neurological:  Positive for numbness (R > L foot). Negative for dizziness and weakness.  Psychiatric/Behavioral:  Positive for decreased concentration, dysphoric mood and sleep disturbance. Negative for agitation and behavioral problems. The patient is nervous/anxious.       Objective:    Physical Exam Vitals reviewed.  Constitutional:      General: She is not in acute distress.    Appearance: She is not diaphoretic.  HENT:     Head: Normocephalic and atraumatic.     Nose: Nose normal.     Mouth/Throat:     Mouth: Mucous membranes are moist.  Eyes:     General: No scleral icterus.    Extraocular Movements:  Extraocular movements intact.  Cardiovascular:     Rate and Rhythm: Normal rate and regular rhythm.     Heart sounds: Normal heart sounds. No murmur heard.    Comments: DPA pulse intact bilaterally Pulmonary:     Breath sounds: Normal breath sounds. No wheezing or rales.  Musculoskeletal:     Cervical back: Neck supple. No  tenderness.     Right lower leg: No edema.     Left lower leg: No edema.     Right ankle: Swelling present. Tenderness present over the lateral malleolus.     Comments: Tenderness over gastrocnemius/soleus tendon area  Skin:    General: Skin is warm.     Findings: Rash (Circular erythematous patch over left upper chest wall, about 3 cm in diameter) present.  Neurological:     General: No focal deficit present.     Mental Status: She is alert and oriented to person, place, and time.     Sensory: No sensory deficit.     Motor: No weakness.  Psychiatric:        Mood and Affect: Mood is depressed.        Behavior: Behavior normal.     BP 101/68   Pulse 85   Ht 5\' 1"  (1.549 m)   Wt 204 lb 9.6 oz (92.8 kg)   SpO2 97%   BMI 38.66 kg/m  Wt Readings from Last 3 Encounters:  01/20/24 204 lb 9.6 oz (92.8 kg)  09/22/23 207 lb 12.8 oz (94.3 kg)  05/20/23 192 lb 3.2 oz (87.2 kg)    Lab Results  Component Value Date   TSH 1.020 05/20/2023   Lab Results  Component Value Date   WBC 7.3 05/20/2023   HGB 14.7 05/20/2023   HCT 43.7 05/20/2023   MCV 96 05/20/2023   PLT 262 05/20/2023   Lab Results  Component Value Date   NA 137 05/20/2023   K 3.9 05/20/2023   CO2 20 05/20/2023   GLUCOSE 112 (H) 05/20/2023   BUN 11 05/20/2023   CREATININE 0.80 05/20/2023   BILITOT <0.2 05/20/2023   ALKPHOS 121 05/20/2023   AST 18 05/20/2023   ALT 15 05/20/2023   PROT 6.3 05/20/2023   ALBUMIN 4.1 05/20/2023   CALCIUM 9.0 05/20/2023   ANIONGAP 9 08/03/2022   EGFR 93 05/20/2023   Lab Results  Component Value Date   CHOL 200 (H) 05/20/2023   Lab Results  Component  Value Date   HDL 45 05/20/2023   Lab Results  Component Value Date   LDLCALC 134 (H) 05/20/2023   Lab Results  Component Value Date   TRIG 116 05/20/2023   Lab Results  Component Value Date   CHOLHDL 4.4 05/20/2023   Lab Results  Component Value Date   HGBA1C 5.5 05/20/2023      Assessment & Plan:   Problem List Items Addressed This Visit       Nervous and Auditory   Idiopathic peripheral neuropathy   Numbness in bilateral feet could be due to peripheral neuropathy Continue gabapentin 100 mg nightly for now due to improvement in symptoms Offered PM&R referral for NCS, but she denies for now      Relevant Medications   buPROPion ER (WELLBUTRIN SR) 100 MG 12 hr tablet   gabapentin (NEURONTIN) 100 MG capsule   cariprazine (VRAYLAR) 1.5 MG capsule     Musculoskeletal and Integument   Tinea corporis   Skin rash on chest wall area likely tinea corporis, has recurred Started Lotrisone cream      Relevant Medications   clotrimazole-betamethasone (LOTRISONE) cream     Other   Moderate episode of recurrent major depressive disorder (HCC) - Primary   Uncontrolled with Wellbutrin 100 mg twice daily Since she has not tolerated other SSRIs and has responded the best with Wellbutrin, would avoid adding SSRI Due to persistent symptoms  of anhedonia and agitation, added Vraylar 1.5 mg QD Has been referred to [facility] therapy      Relevant Medications   buPROPion ER (WELLBUTRIN SR) 100 MG 12 hr tablet   cariprazine (VRAYLAR) 1.5 MG capsule   GAD (generalized anxiety disorder)   Overall controlled with propranolol Has episodes of anxiety and insomnia, used to take Elavil as needed, has not taken it recently      Relevant Medications   buPROPion ER (WELLBUTRIN SR) 100 MG 12 hr tablet   propranolol (INDERAL) 20 MG tablet   Tobacco abuse   Smokes about 0.5 pack/day, h/o 1 pack/day X 20 years  Asked about quitting: confirms that he/she currently smokes cigarettes Advise to  quit smoking: Educated about QUITTING to reduce the risk of cancer, cardio and cerebrovascular disease. Assess willingness: Unwilling to quit at this time, but is working on cutting back. Assist with counseling and pharmacotherapy: Counseled for 5 minutes and literature provided. On Wellbutrin. Arrange for follow up: follow up in 3 months and continue to offer help.      Other Visit Diagnoses       Colon cancer screening       Relevant Orders   Cologuard        Meds ordered this encounter  Medications   buPROPion ER (WELLBUTRIN SR) 100 MG 12 hr tablet    Sig: Take 1 tablet (100 mg total) by mouth 2 (two) times daily.    Dispense:  180 tablet    Refill:  1   gabapentin (NEURONTIN) 100 MG capsule    Sig: Take 1 capsule (100 mg total) by mouth at bedtime.    Dispense:  30 capsule    Refill:  3   propranolol (INDERAL) 20 MG tablet    Sig: Take 1 tablet (20 mg total) by mouth 2 (two) times daily.    Dispense:  180 tablet    Refill:  1   clotrimazole-betamethasone (LOTRISONE) cream    Sig: Apply 1 Application topically 2 (two) times daily.    Dispense:  45 g    Refill:  0   cariprazine (VRAYLAR) 1.5 MG capsule    Sig: Take 1 capsule (1.5 mg total) by mouth daily.    Dispense:  30 capsule    Refill:  3    Follow-up: Return in about 4 months (around 05/21/2024) for Annual physical (after 05/19/24).    Anabel Halon, MD

## 2024-01-20 NOTE — Assessment & Plan Note (Signed)
 Skin rash on chest wall area likely tinea corporis, has recurred Started Lotrisone cream

## 2024-04-17 ENCOUNTER — Other Ambulatory Visit: Payer: Self-pay

## 2024-04-17 ENCOUNTER — Emergency Department (HOSPITAL_COMMUNITY)
Admission: EM | Admit: 2024-04-17 | Discharge: 2024-04-17 | Disposition: A | Discharge status: Home / Self Care | Attending: Emergency Medicine | Admitting: Emergency Medicine

## 2024-04-17 ENCOUNTER — Encounter (HOSPITAL_COMMUNITY): Payer: Self-pay

## 2024-04-17 DIAGNOSIS — L089 Local infection of the skin and subcutaneous tissue, unspecified: Secondary | ICD-10-CM

## 2024-04-17 DIAGNOSIS — L03011 Cellulitis of right finger: Secondary | ICD-10-CM | POA: Diagnosis not present

## 2024-04-17 DIAGNOSIS — M79644 Pain in right finger(s): Secondary | ICD-10-CM | POA: Diagnosis present

## 2024-04-17 DIAGNOSIS — L0889 Other specified local infections of the skin and subcutaneous tissue: Secondary | ICD-10-CM | POA: Insufficient documentation

## 2024-04-17 MED ORDER — LIDOCAINE HCL (PF) 2 % IJ SOLN
INTRAMUSCULAR | Status: AC
  Filled 2024-04-17: qty 5

## 2024-04-17 MED ORDER — LIDOCAINE HCL (PF) 2 % IJ SOLN
10.0000 mL | Freq: Once | INTRAMUSCULAR | Status: AC
  Administered 2024-04-17: 10 mL

## 2024-04-17 MED ORDER — DOXYCYCLINE HYCLATE 100 MG PO CAPS: 100.0000 mg | ORAL_CAPSULE | Freq: Two times a day (BID) | ORAL | 0 refills | Status: AC

## 2024-04-17 NOTE — ED Triage Notes (Signed)
 Patient arrives via POV from home. C/o pain to right thumb. Patient seen in UC 4 days ago treated for infection and started on antibiotics. Patient reports pain and swelling is not improving.

## 2024-04-17 NOTE — Discharge Instructions (Addendum)
 You presented with an improving paronychia while on Keflex . Given persistent but improving symptoms, we will add on Doxycycline . Please follow-up with your PCP to ensure resolution, return if infection worsens. It is actively draining and was lanced in the ED. Clean with warm soapy water every day and change dressings daily.

## 2024-04-17 NOTE — ED Provider Notes (Signed)
 Summer Golden   CSN: [id number] Arrival date & time: 04/17/24  1025     Patient presents with: Finger Injury   Summer Golden is a 43 y.o. female.  {Add pertinent medical, surgical, social history, OB history to HPI:32947} HPI     Prior to Admission medications   Medication Sig Start Date End Date Taking? Authorizing Provider  amitriptyline (ELAVIL) 25 MG tablet Take by mouth. 07/23/21   [provider]  buPROPion  ER (WELLBUTRIN  SR) 100 MG 12 hr tablet Take 1 tablet (100 mg total) by mouth 2 (two) times daily. 01/20/24   Meldon Sport, MD  cariprazine  (VRAYLAR ) 1.5 MG capsule Take 1 capsule (1.5 mg total) by mouth daily. 01/20/24   Meldon Sport, MD  clotrimazole -betamethasone  (LOTRISONE ) cream Apply 1 Application topically 2 (two) times daily. 01/20/24   Meldon Sport, MD  etonogestrel (IMPLANON) 68 MG IMPL implant Inject 1 each into the skin once.    [provider]  gabapentin  (NEURONTIN ) 100 MG capsule Take 1 capsule (100 mg total) by mouth at bedtime. 01/20/24   Meldon Sport, MD  Multiple Vitamin (MULTI-VITAMIN PO) Take by mouth.    [provider]  propranolol  (INDERAL ) 20 MG tablet Take 1 tablet (20 mg total) by mouth 2 (two) times daily. 01/20/24   Meldon Sport, MD    Allergies: Morphine  and Serotonin reuptake inhibitors (ssris)    Review of Systems  Updated Vital Signs BP 110/60 (BP Location: Left Arm)   Pulse 71   Temp 98.3 F (36.8 C) (Oral)   Resp 15   Ht 5' 1 (1.549 m)   Wt 96.6 kg   SpO2 98%   BMI 40.25 kg/m   Physical Exam  (all labs ordered are listed, but only abnormal results are displayed) Labs Reviewed - No data to display  EKG: None  Radiology: No results found.  {Document cardiac monitor, telemetry assessment procedure when appropriate:32947} .Incision and Drainage  Date/Time: 04/17/2024 12:48 PM  Performed by: Rosealee Concha, MD Authorized by:  Rosealee Concha, MD   Consent:    Consent obtained:  Verbal   Consent given by:  Patient   Risks discussed:  Bleeding, incomplete drainage and pain Universal protocol:    Patient identity confirmed:  Verbally with patient Location:    Type:  Abscess   Location:  Upper extremity   Upper extremity location:  Finger   Finger location:  R thumb Pre-procedure details:    Skin preparation:  Povidone-iodine Anesthesia:    Anesthesia method:  Local infiltration   Local anesthetic:  Lidocaine  2% w/o epi Procedure type:    Complexity:  Simple Procedure details:    Incision types:  Stab incision   Wound management:  Irrigated with saline   Drainage:  Bloody and purulent   Drainage amount:  Scant   Packing materials:  1/2 in gauze Post-procedure details:    Procedure completion:  Tolerated    Medications Ordered in the ED - No data to display    {Click here for ABCD2, HEART and other calculators REFRESH Golden before signing:1}                              Medical Decision Making Risk Prescription drug management.   ***  {Document critical care time when appropriate  Document review of labs and clinical decision tools ie CHADS2VASC2, etc  Document your independent  review of radiology images and any outside records  Document your discussion with family members, caretakers and with consultants  Document social determinants of health affecting pt's care  Document your decision making why or why not admission, treatments were needed:32947:::1}   Final diagnoses:  None    ED Discharge Orders     None

## 2024-05-23 ENCOUNTER — Ambulatory Visit (INDEPENDENT_AMBULATORY_CARE_PROVIDER_SITE_OTHER): Admitting: Internal Medicine

## 2024-05-23 ENCOUNTER — Encounter: Payer: Self-pay | Admitting: Internal Medicine

## 2024-05-23 VITALS — BP 113/75 | HR 87 | Ht 61.0 in | Wt 212.4 lb

## 2024-05-23 DIAGNOSIS — E559 Vitamin D deficiency, unspecified: Secondary | ICD-10-CM

## 2024-05-23 DIAGNOSIS — Z124 Encounter for screening for malignant neoplasm of cervix: Secondary | ICD-10-CM

## 2024-05-23 DIAGNOSIS — Z0001 Encounter for general adult medical examination with abnormal findings: Secondary | ICD-10-CM

## 2024-05-23 DIAGNOSIS — F331 Major depressive disorder, recurrent, moderate: Secondary | ICD-10-CM

## 2024-05-23 DIAGNOSIS — R739 Hyperglycemia, unspecified: Secondary | ICD-10-CM

## 2024-05-23 DIAGNOSIS — G609 Hereditary and idiopathic neuropathy, unspecified: Secondary | ICD-10-CM

## 2024-05-23 DIAGNOSIS — E782 Mixed hyperlipidemia: Secondary | ICD-10-CM

## 2024-05-23 DIAGNOSIS — F411 Generalized anxiety disorder: Secondary | ICD-10-CM

## 2024-05-23 DIAGNOSIS — Z1211 Encounter for screening for malignant neoplasm of colon: Secondary | ICD-10-CM

## 2024-05-23 MED ORDER — CARIPRAZINE HCL 1.5 MG PO CAPS: 1.5000 mg | ORAL_CAPSULE | Freq: Every day | ORAL | 1 refills | Status: AC

## 2024-05-23 MED ORDER — GABAPENTIN 100 MG PO CAPS: 100.0000 mg | ORAL_CAPSULE | Freq: Every day | ORAL | 1 refills | Status: AC

## 2024-05-23 NOTE — Assessment & Plan Note (Addendum)
 Better controlled with Wellbutrin  100 mg twice daily and Vraylar  1.5 mg QD Since she has not tolerated other SSRIs and has responded the best with Wellbutrin , would avoid adding SSRI Due to persistent symptoms of anhedonia and agitation, had added Vraylar  1.5 mg QD Has been referred to [facility] therapy, but did not continue follow-up

## 2024-05-23 NOTE — Patient Instructions (Addendum)
Please continue to take medications as prescribed.  Please continue to follow low carb diet and perform moderate exercise/walking at least 150 mins/week.  Please get fasting blood tests done within a week.  

## 2024-05-23 NOTE — Assessment & Plan Note (Signed)
 Physical exam as documented. Fasting blood tests ordered today. Needs PAP smear with Ob.Gyn. -Referred to [facility] clinic Had ordered Cologuard for colon cancer screening, but she prefers to get colonoscopy now-referred to GI

## 2024-05-23 NOTE — Assessment & Plan Note (Signed)
Advised to follow low cholesterol diet for now

## 2024-05-23 NOTE — Progress Notes (Signed)
 Established Patient Office Visit  Subjective:  Patient ID: Summer Golden, female    DOB: [birth year]  Age: 43 y.o. MRN: [id number]  CC:  Chief Complaint  Patient presents with   Annual Exam    Annual exam     HPI Summer Golden is a 43 y.o. female with past medical history of MDD, GAD and tobacco abuse who presents for annual physical.  MDD and GAD: She has history of MDD and has been taking Wellbutrin  and Vraylar  for it.  She has noticed improvement in anhedonia and anxiety now.  She is also taking propranolol  for GAD.  She has history of polysubstance abuse and mood disorder due to it, but has quit illicit substance use.  She has tried Celexa, Prozac and Lexapro.  She has had suicidal ideations with other antidepressants. She gets irritated with mild triggers.  She denies any suicidal plan currently.  She has good support system currently.  She stated that Wellbutrin  has worked the best for her and she is alive because of it.  She was also prescribed Elavil while she was in [place] , for insomnia, but has not taken it recently.  She smokes about 0.5 pack/day.  She has been trying to cut down.  Denies any chronic cough, dyspnea or wheezing currently.  She has numbness of bilateral feet, worse around lateral side of the right foot.  She feels loosening of the fifth digit at times.  She also reports pain below right ankle lateral malleolus area, which is chronic.  She has been taking meloxicam  with some relief.  She states that her ankle pain also worsens with standing. She has felt better with Gabapentin .  She had paronychia of right thumb in 06/25.  Her fingernail has fallen off, and has been healing slowly now.  Denies any pain or discharge from the thumb nailbed.  Past Medical History:  Diagnosis Date   Anemia    Anxiety    Arthritis    Asthma    as child   Chronic chest wall pain    Chronic left shoulder pain    Chronic neck pain    Depressed    GERD (gastroesophageal reflux  disease)    History of gout    Murmur    as child   Panic    Suicide attempt (HCC)    1.attempted overdose on narcotics, 2. attempted to cut self w/ rock 3. attempted to drive car off road.     Past Surgical History:  Procedure Laterality Date   APPENDECTOMY     CARPAL TUNNEL RELEASE Left 06/19/2016   Procedure: LEFT CARPAL TUNNEL RELEASE;  Surgeon: Taft FORBES Minerva, MD;  Location: [facility];  Service: Orthopedics;  Laterality: Left;   MYRINGOTOMY WITH TUBE PLACEMENT Bilateral     Family History  Problem Relation Age of Onset   Heart disease Mother    Diabetes Mother    COPD Father    Cancer Other    Diabetes Other     Social History   Socioeconomic History   Marital status: Legally Separated    Spouse name: Not on file   Number of children: Not on file   Years of education: Not on file   Highest education level: Not on file  Occupational History   Not on file  Tobacco Use   Smoking status: Every Day    Current packs/day: 1.00    Average packs/day: 1 pack/day for 21.0 years (21.0 ttl pk-yrs)    Types:  Cigarettes   Smokeless tobacco: Never  Vaping Use   Vaping status: Former  Substance and Sexual Activity   Alcohol use: No    Comment: history heavy etoh   Drug use: Yes    Types: Marijuana    Comment: MJ daily.   history of pain pills and adderall   Sexual activity: Yes    Birth control/protection: Implant  Other Topics Concern   Not on file  Social History Narrative   Not on file   Social Drivers of Health   Financial Resource Strain: Not on file  Food Insecurity: Not on file  Transportation Needs: Not on file  Physical Activity: Not on file  Stress: Not on file  Social Connections: Not on file  Intimate Partner Violence: Not on file    Outpatient Medications Prior to Visit  Medication Sig Dispense Refill   amitriptyline (ELAVIL) 25 MG tablet Take by mouth.     buPROPion  ER (WELLBUTRIN  SR) 100 MG 12 hr tablet Take 1 tablet (100 mg total) by mouth 2  (two) times daily. 180 tablet 1   clotrimazole -betamethasone  (LOTRISONE ) cream Apply 1 Application topically 2 (two) times daily. 45 g 0   etonogestrel (IMPLANON) 68 MG IMPL implant Inject 1 each into the skin once.     Multiple Vitamin (MULTI-VITAMIN PO) Take by mouth.     propranolol  (INDERAL ) 20 MG tablet Take 1 tablet (20 mg total) by mouth 2 (two) times daily. 180 tablet 1   cariprazine  (VRAYLAR ) 1.5 MG capsule Take 1 capsule (1.5 mg total) by mouth daily. 30 capsule 3   gabapentin  (NEURONTIN ) 100 MG capsule Take 1 capsule (100 mg total) by mouth at bedtime. 30 capsule 3   No facility-administered medications prior to visit.    Allergies  Allergen Reactions   Morphine  Other (See Comments)    Makes her feel like she is drowning   Serotonin Reuptake Inhibitors (Ssris)     Prozac Lexapro Celexa -these all make her suicidal     ROS Review of Systems  Constitutional:  Negative for chills and fever.  HENT:  Negative for congestion, sinus pressure, sinus pain and sore throat.   Eyes:  Negative for pain and discharge.  Respiratory:  Negative for cough and shortness of breath.   Cardiovascular:  Positive for leg swelling. Negative for chest pain and palpitations.  Gastrointestinal:  Negative for abdominal pain, diarrhea, nausea and vomiting.  Endocrine: Negative for polydipsia and polyuria.  Genitourinary:  Negative for dysuria and hematuria.  Musculoskeletal:  Negative for neck pain and neck stiffness.       Right leg and ankle pain  Skin:  Negative for rash.  Neurological:  Positive for numbness (R > L foot). Negative for dizziness and weakness.  Psychiatric/Behavioral:  Positive for dysphoric mood and sleep disturbance. Negative for agitation and behavioral problems. The patient is nervous/anxious.       Objective:    Physical Exam Vitals reviewed.  Constitutional:      General: She is not in acute distress.    Appearance: She is not diaphoretic.  HENT:     Head:  Normocephalic and atraumatic.     Nose: Nose normal.     Mouth/Throat:     Mouth: Mucous membranes are moist.  Eyes:     General: No scleral icterus.    Extraocular Movements: Extraocular movements intact.  Cardiovascular:     Rate and Rhythm: Normal rate and regular rhythm.     Heart sounds: Normal heart sounds. No  murmur heard.    Comments: DPA pulse intact bilaterally Pulmonary:     Breath sounds: Normal breath sounds. No wheezing or rales.  Abdominal:     Palpations: Abdomen is soft.     Tenderness: There is no abdominal tenderness.  Musculoskeletal:     Cervical back: Neck supple. No tenderness.     Right lower leg: No edema.     Left lower leg: No edema.  Skin:    General: Skin is warm.     Findings: No rash.     Comments: Right thumb nail healing well  Neurological:     General: No focal deficit present.     Mental Status: She is alert and oriented to person, place, and time.     Cranial Nerves: No cranial nerve deficit.     Sensory: No sensory deficit.     Motor: No weakness.  Psychiatric:        Mood and Affect: Mood normal.        Behavior: Behavior normal.     BP 113/75   Pulse 87   Ht 5' 1 (1.549 m)   Wt 212 lb 6.4 oz (96.3 kg)   SpO2 94%   BMI 40.13 kg/m  Wt Readings from Last 3 Encounters:  05/23/24 212 lb 6.4 oz (96.3 kg)  04/17/24 213 lb (96.6 kg)  01/20/24 204 lb 9.6 oz (92.8 kg)    Lab Results  Component Value Date   TSH 1.020 05/20/2023   Lab Results  Component Value Date   WBC 7.3 05/20/2023   HGB 14.7 05/20/2023   HCT 43.7 05/20/2023   MCV 96 05/20/2023   PLT 262 05/20/2023   Lab Results  Component Value Date   NA 137 05/20/2023   K 3.9 05/20/2023   CO2 20 05/20/2023   GLUCOSE 112 (H) 05/20/2023   BUN 11 05/20/2023   CREATININE 0.80 05/20/2023   BILITOT <0.2 05/20/2023   ALKPHOS 121 05/20/2023   AST 18 05/20/2023   ALT 15 05/20/2023   PROT 6.3 05/20/2023   ALBUMIN 4.1 05/20/2023   CALCIUM 9.0 05/20/2023   ANIONGAP 9  08/03/2022   EGFR 93 05/20/2023   Lab Results  Component Value Date   CHOL 200 (H) 05/20/2023   Lab Results  Component Value Date   HDL 45 05/20/2023   Lab Results  Component Value Date   LDLCALC 134 (H) 05/20/2023   Lab Results  Component Value Date   TRIG 116 05/20/2023   Lab Results  Component Value Date   CHOLHDL 4.4 05/20/2023   Lab Results  Component Value Date   HGBA1C 5.5 05/20/2023      Assessment & Plan:   Problem List Items Addressed This Visit       Nervous and Auditory   Idiopathic peripheral neuropathy   Numbness in bilateral feet could be due to peripheral neuropathy Continue gabapentin  100 mg nightly for now due to improvement in symptoms Offered PM&R referral for NCS, but she denies for now      Relevant Medications   cariprazine  (VRAYLAR ) 1.5 MG capsule   gabapentin  (NEURONTIN ) 100 MG capsule     Other   Moderate episode of recurrent major depressive disorder (HCC)   Better controlled with Wellbutrin  100 mg twice daily and Vraylar  1.5 mg QD Since she has not tolerated other SSRIs and has responded the best with Wellbutrin , would avoid adding SSRI Due to persistent symptoms of anhedonia and agitation, had added Vraylar  1.5 mg QD Has  been referred to [facility] therapy, but did not continue follow-up      Relevant Medications   cariprazine  (VRAYLAR ) 1.5 MG capsule   Other Relevant Orders   TSH   CMP14+EGFR   CBC with Differential/Platelet   GAD (generalized anxiety disorder)   Overall controlled with propranolol  Has episodes of anxiety and insomnia, used to take Elavil as needed, has not taken it recently      Relevant Orders   TSH   Mixed hyperlipidemia   Advised to follow low cholesterol diet for now      Relevant Orders   Lipid panel   Encounter for general adult medical examination with abnormal findings - Primary   Physical exam as documented. Fasting blood tests ordered today. Needs PAP smear with Ob.Gyn. -Referred to [facility] Had ordered Cologuard for colon cancer screening, but she prefers to get colonoscopy now-referred to GI      Other Visit Diagnoses       Vitamin D  deficiency       Relevant Orders   VITAMIN D  25 Hydroxy (Vit-D Deficiency, Fractures)     Hyperglycemia       Relevant Orders   Hemoglobin A1c   CMP14+EGFR     Colon cancer screening       Relevant Orders   Ambulatory referral to Gastroenterology     Cervical cancer screening       Relevant Orders   Ambulatory referral to Obstetrics / Gynecology        Meds ordered this encounter  Medications   cariprazine  (VRAYLAR ) 1.5 MG capsule    Sig: Take 1 capsule (1.5 mg total) by mouth daily.    Dispense:  90 capsule    Refill:  1   gabapentin  (NEURONTIN ) 100 MG capsule    Sig: Take 1 capsule (100 mg total) by mouth at bedtime.    Dispense:  90 capsule    Refill:  1    Follow-up: Return in about 4 months (around 09/23/2024) for MDD and GAD.    Summer MARLA Blanch, MD

## 2024-05-23 NOTE — Assessment & Plan Note (Signed)
 Overall controlled with propranolol Has episodes of anxiety and insomnia, used to take Elavil as needed, has not taken it recently

## 2024-05-23 NOTE — Assessment & Plan Note (Signed)
 Numbness in bilateral feet could be due to peripheral neuropathy Continue gabapentin 100 mg nightly for now due to improvement in symptoms Offered PM&R referral for NCS, but she denies for now

## 2024-05-30 ENCOUNTER — Encounter (INDEPENDENT_AMBULATORY_CARE_PROVIDER_SITE_OTHER): Payer: Self-pay | Admitting: *Deleted

## 2024-06-28 ENCOUNTER — Ambulatory Visit: Admitting: Women's Health

## 2024-06-28 ENCOUNTER — Other Ambulatory Visit (HOSPITAL_COMMUNITY)
Admission: RE | Admit: 2024-06-28 | Discharge: 2024-06-28 | Disposition: A | Discharge status: Home / Self Care | Source: Ambulatory Visit | Attending: Women's Health | Admitting: Women's Health

## 2024-06-28 ENCOUNTER — Encounter: Payer: Self-pay | Admitting: Women's Health

## 2024-06-28 VITALS — BP 94/65 | HR 83 | Ht 61.0 in | Wt 214.8 lb

## 2024-06-28 DIAGNOSIS — Z01419 Encounter for gynecological examination (general) (routine) without abnormal findings: Secondary | ICD-10-CM | POA: Diagnosis not present

## 2024-06-28 DIAGNOSIS — Z1231 Encounter for screening mammogram for malignant neoplasm of breast: Secondary | ICD-10-CM

## 2024-06-28 DIAGNOSIS — N941 Unspecified dyspareunia: Secondary | ICD-10-CM

## 2024-06-28 DIAGNOSIS — N921 Excessive and frequent menstruation with irregular cycle: Secondary | ICD-10-CM

## 2024-06-28 DIAGNOSIS — Z113 Encounter for screening for infections with a predominantly sexual mode of transmission: Secondary | ICD-10-CM

## 2024-06-28 NOTE — Progress Notes (Signed)
 WELL-WOMAN EXAMINATION Patient name: MARSHA GUNDLACH MRN [id number]  Date of birth: 1981/03/13 Chief Complaint:   new gyn (Referral.implant expired 2018, last pap 2018)  History of Present Illness:   ADALIND WEITZ is a 43 y.o. G53P3003 Caucasian female being seen today for a routine well-woman exam.  Current complaints: periods q other month, last 1.5wks, changes pad q 2-3hrs, some quarter sized clots, cramps. Pain w/ sex x 53yrs, more on Lt. Denies abnormal discharge, itching/odor/irritation.    PCP: Tobie      Patient's last menstrual period was 06/27/2024. The current method of family planning is coitus interruptus, has Nexplanon that expired in 2018 Last pap 2018. Results were: negative per pt report at [facility]. H/O abnormal pap: yes long time ago Last mammogram: never. Results were: N/A. Family h/o breast cancer: no Last colonoscopy: never. Results were: N/A. Family h/o colorectal cancer: no     06/28/2024    1:34 PM 05/23/2024    4:17 PM 01/20/2024    4:26 PM 09/22/2023    4:17 PM 05/20/2023    4:41 PM  Depression screen PHQ 2/9  Decreased Interest 0 0 0 1 1  Down, Depressed, Hopeless 0 0 0 1 1  PHQ - 2 Score 0 0 0 2 2  Altered sleeping 0 0 0 1 1  Tired, decreased energy 0 0 0 1 1  Change in appetite 0 0 0 1 1  Feeling bad or failure about yourself  0 0 0 1 1  Trouble concentrating 0 0 0 0 0  Moving slowly or fidgety/restless 0 0 0 0 0  Suicidal thoughts 0 0 0 0 0  PHQ-9 Score 0 0 0 6 6  Difficult doing work/chores   Not difficult at all Somewhat difficult         06/28/2024    1:34 PM 05/23/2024    4:17 PM 01/20/2024    4:26 PM 09/22/2023    4:17 PM  GAD 7 : Generalized Anxiety Score  Nervous, Anxious, on Edge 0 0 0 1  Control/stop worrying 1 0 0   Worry too much - different things 1 0 0 1  Trouble relaxing 0 0 0 1  Restless 1 0 0 1  Easily annoyed or irritable 1 0 0 1  Afraid - awful might happen 1 0 0 1  Total GAD 7 Score 5 0 0   Anxiety Difficulty   Not difficult at  all Somewhat difficult     Review of Systems:   Pertinent items are noted in HPI Denies any headaches, blurred vision, fatigue, shortness of breath, chest pain, abdominal pain, abnormal vaginal discharge/itching/odor/irritation, problems with periods, bowel movements, urination, or intercourse unless otherwise stated above. Pertinent History Reviewed:  Reviewed past medical,surgical, social and family history.  Reviewed problem list, medications and allergies. Physical Assessment:   Vitals:   06/28/24 1329  BP: 94/65  Pulse: 83  Weight: 214 lb 12.8 oz (97.4 kg)  Height: 5' 1 (1.549 m)  Body mass index is 40.59 kg/m.        Physical Examination:   General appearance - well appearing, and in no distress  Mental status - alert, oriented to person, place, and time  Psych:  She has a normal mood and affect  Skin - warm and dry, normal color, no suspicious lesions noted  Chest - effort normal, all lung fields clear to auscultation bilaterally  Heart - normal rate and regular rhythm  Neck:  midline trachea, no thyromegaly  or nodules  Breasts - breasts appear normal, no suspicious masses, no skin or nipple changes or  axillary nodes  Abdomen - soft, nontender, nondistended, no masses or organomegaly  Pelvic - VULVA: normal appearing vulva with no masses, tenderness or lesions  VAGINA: normal appearing vagina with normal color and discharge, no lesions, mod amt menstrual blood  CERVIX: normal appearing cervix without discharge or lesions, no CMT  Thin prep pap is done w/ HR HPV cotesting  UTERUS: uterus is felt to be normal size, shape, consistency and nontender   ADNEXA: No adnexal masses or tenderness noted.  Extremities:  No swelling or varicosities noted  Chaperone: Peggy Dones  No results found for this or any previous visit (from the past 24 hours).  Assessment & Plan:  1) Well-Woman Exam  2) Menorrhagia w/ irregular periods, Lt sided dyspareunia x 32yr> CV swab, will get  pelvic u/s  3) Expired Nexplanon> wants another, will schedule removal/reinsert today, continue pullout/condoms, prefer abstinence x2wk before  Labs/procedures today: pap, CV swab  Mammogram: schedule screening mammo as soon as possible, order placed and gave number to call to schedule Colonoscopy: in process of getting scheduled now  Orders Placed This Encounter  Procedures   US  PELVIC COMPLETE WITH TRANSVAGINAL   MM 3D SCREENING MAMMOGRAM BILATERAL BREAST    Meds: No orders of the defined types were placed in this encounter.   Follow-up: Return for Nexplanon removal/reinsertion after gyn u/s (putting in orders at [facility]).  Suzen JONELLE Fetters CNM, [facility] 06/28/2024 2:07 PM

## 2024-06-28 NOTE — Patient Instructions (Signed)
Call [phone] to schedule your mammogram 

## 2024-06-30 LAB — CERVICOVAGINAL ANCILLARY ONLY
Bacterial Vaginitis (gardnerella): POSITIVE — AB
Candida Glabrata: POSITIVE — AB
Candida Vaginitis: NEGATIVE
Chlamydia: NEGATIVE
Comment: NEGATIVE
Comment: NEGATIVE
Comment: NEGATIVE
Comment: NEGATIVE
Comment: NEGATIVE
Comment: NORMAL
Neisseria Gonorrhea: NEGATIVE
Trichomonas: NEGATIVE

## 2024-07-04 ENCOUNTER — Ambulatory Visit: Payer: Self-pay | Admitting: Women's Health

## 2024-07-04 MED ORDER — METRONIDAZOLE 500 MG PO TABS: 500.0000 mg | ORAL_TABLET | Freq: Two times a day (BID) | ORAL | 0 refills | Status: AC

## 2024-07-04 MED ORDER — BORIC ACID VAGINAL 600 MG VA SUPP
600.0000 mg | Freq: Every day | VAGINAL | 0 refills | Status: AC
Start: 2024-07-04 — End: ?

## 2024-07-05 ENCOUNTER — Ambulatory Visit (HOSPITAL_COMMUNITY): Admission: RE | Admit: 2024-07-05 | Source: Ambulatory Visit

## 2024-07-05 LAB — CYTOLOGY - PAP
Comment: NEGATIVE
Diagnosis: NEGATIVE
Diagnosis: REACTIVE
High risk HPV: NEGATIVE

## 2024-07-06 ENCOUNTER — Telehealth: Payer: Self-pay

## 2024-07-06 NOTE — Telephone Encounter (Signed)
 RN called patient with results from Pap and swab. Patient positive for BV and yeast RN educated that it is not an STD but a change in pH. Educated on the medication sent in how and when to take. All questions answered.

## 2024-07-11 NOTE — Telephone Encounter (Signed)
 LMOVM that recent swab showed BV and yeast. Medications for both have been sent in to her pharmacy. Advised to call our office with any further information.

## 2024-07-19 ENCOUNTER — Encounter: Payer: Self-pay | Admitting: Women's Health

## 2024-07-19 ENCOUNTER — Ambulatory Visit (INDEPENDENT_AMBULATORY_CARE_PROVIDER_SITE_OTHER): Admitting: Women's Health

## 2024-07-19 VITALS — BP 113/76 | HR 76 | Ht 64.0 in | Wt 211.0 lb

## 2024-07-19 DIAGNOSIS — Z30017 Encounter for initial prescription of implantable subdermal contraceptive: Secondary | ICD-10-CM | POA: Insufficient documentation

## 2024-07-19 DIAGNOSIS — Z3202 Encounter for pregnancy test, result negative: Secondary | ICD-10-CM | POA: Diagnosis not present

## 2024-07-19 DIAGNOSIS — Z3046 Encounter for surveillance of implantable subdermal contraceptive: Secondary | ICD-10-CM | POA: Diagnosis not present

## 2024-07-19 DIAGNOSIS — R6882 Decreased libido: Secondary | ICD-10-CM

## 2024-07-19 LAB — POCT URINE PREGNANCY: Preg Test, Ur: NEGATIVE

## 2024-07-19 MED ORDER — ETONOGESTREL 68 MG ~~LOC~~ IMPL
68.0000 mg | DRUG_IMPLANT | Freq: Once | SUBCUTANEOUS | Status: AC
  Administered 2024-07-19: 68 mg via SUBCUTANEOUS

## 2024-07-19 NOTE — Progress Notes (Signed)
 NEXPLANON  REMOVAL AND RE-INSERTION Patient name: Summer Golden MRN [id number]  Date of birth: 29-Aug-1981 Subjective Findings:   Summer Golden is a 43 y.o. G31P3003 Caucasian female being seen today for Nexplanon  removal and re-insertion. Her Nexplanon  was placed she believes 2015. Reports decreased sex drive. Stopped all antidepressants about a month ago.   Patient's last menstrual period was 06/27/2024. Last pap 06/28/24. Results were: NILM w/ HRHPV negative  Risks/benefits/side effects of Nexplanon  have been discussed and her questions have been answered.  Specifically, a failure rate of 11/998 has been reported, with an increased failure rate if pt takes St. John's Wort and/or antiseizure medicaitons.  She is aware of the common side effect of irregular bleeding, which the incidence of decreases over time. Signed copy of informed consent in chart.      06/28/2024    1:34 PM 05/23/2024    4:17 PM 01/20/2024    4:26 PM 09/22/2023    4:17 PM 05/20/2023    4:41 PM  Depression screen PHQ 2/9  Decreased Interest 0 0 0 1 1  Down, Depressed, Hopeless 0 0 0 1 1  PHQ - 2 Score 0 0 0 2 2  Altered sleeping 0 0 0 1 1  Tired, decreased energy 0 0 0 1 1  Change in appetite 0 0 0 1 1  Feeling bad or failure about yourself  0 0 0 1 1  Trouble concentrating 0 0 0 0 0  Moving slowly or fidgety/restless 0 0 0 0 0  Suicidal thoughts 0 0 0 0 0  PHQ-9 Score 0 0 0 6 6  Difficult doing work/chores   Not difficult at all Somewhat difficult         06/28/2024    1:34 PM 05/23/2024    4:17 PM 01/20/2024    4:26 PM 09/22/2023    4:17 PM  GAD 7 : Generalized Anxiety Score  Nervous, Anxious, on Edge 0 0 0 1  Control/stop worrying 1 0 0   Worry too much - different things 1 0 0 1  Trouble relaxing 0 0 0 1  Restless 1 0 0 1  Easily annoyed or irritable 1 0 0 1  Afraid - awful might happen 1 0 0 1  Total GAD 7 Score 5 0 0   Anxiety Difficulty   Not difficult at all Somewhat difficult     Pertinent  History Reviewed:   Reviewed past medical,surgical, social, obstetrical and family history.  Reviewed problem list, medications and allergies. Objective Findings & Procedure:    Vitals:   07/19/24 1147  BP: 113/76  Pulse: 76  Weight: 211 lb (95.7 kg)  Height: 5' 4 (1.626 m)  Body mass index is 36.22 kg/m.  Results for orders placed or performed in visit on 07/19/24 (from the past 24 hours)  POCT urine pregnancy   Collection Time: 07/19/24 12:15 PM  Result Value Ref Range   Preg Test, Ur Negative Negative     Time out was performed.  Nexplanon  site identified.  Area prepped in usual sterile fashon. Two cc's of 2% lidocaine  was used to anesthetize the area. A small stab incision was made right beside the implant on the distal portion.  The Nexplanon  rod was grasped using hemostats and removed intact without difficulty.  The area was cleansed again with betadine and the Nexplanon  was inserted approximately 10cm from the medial epicondyle and 3-5cm posterior to the sulcus per manufacturer's recommendations without difficulty.  Steri-strips and a  pressure bandage was applied.  There was less than 3 cc blood loss. There were no complications.  The patient tolerated the procedure well. Assessment & Plan:   1) Nexplanon  removal & re-insertion She was instructed to keep the area clean and dry, remove pressure bandage in 24 hours, and keep insertion site covered with the steri-strips for 3-5 days.  She was given a card indicating date Nexplanon  was inserted and date it needs to be removed.  Follow-up PRN problems.  2) Menorrhagia w/ irregular periods and Lt sided dyspareunia x 41yrs> missed pelvic u/s, to call and reschedule  3) Decreased sex drive> discussed tips  Orders Placed This Encounter  Procedures   POCT urine pregnancy    Follow-up: Return in about 1 year (around 07/19/2025) for Physical.  Summer Golden CNM, WHNP-BC 07/19/2024 12:23 PM

## 2024-07-19 NOTE — Addendum Note (Signed)
 Addended by: SANNA GONG A on: 07/19/2024 12:48 PM   Modules accepted: Orders

## 2024-07-19 NOTE — Patient Instructions (Signed)
 Keep the area clean and dry.  You can remove the big bandage in 24 hours, and the small steri-strip bandage in 3-5 days.  A back up method, such as condoms, should be used for two weeks. You may have irregular vaginal bleeding for the first 6 months after the Nexplanon  is placed, then the bleeding usually lightens and it is possible that you may not have any periods.  If you have any concerns, please give us  a call.    Etonogestrel  Implant What is this medication? ETONOGESTREL  (et oh noe JES trel) prevents ovulation and pregnancy. It belongs to a group of medications called contraceptives. This medication is a progestin hormone. This medicine may be used for other purposes; ask your health care provider or pharmacist if you have questions. COMMON BRAND NAME(S): Implanon , Nexplanon  What should I tell my care team before I take this medication? They need to know if you have any of these conditions: Abnormal vaginal bleeding Blood clots Blood vessel disease Breast, cervical, endometrial, ovarian, liver, or uterine cancer Diabetes Gallbladder disease Heart disease or recent heart attack High blood pressure High cholesterol or triglycerides Kidney disease Liver disease Migraine headaches Seizures Stroke Tobacco use An unusual or allergic reaction to etonogestrel , other medications, foods, dyes, or preservatives Pregnant or trying to get pregnant Breastfeeding How should I use this medication? This device is inserted just under the skin on the inner side of your upper arm by your care team. Talk to your care team about the use of this medication in children. Special care may be needed. Overdosage: If you think you have taken too much of this medicine contact a poison control center or emergency room at once. NOTE: This medicine is only for you. Do not share this medicine with others. What if I miss a dose? This does not apply. What may interact with this medication? Do not take this  medication with any of the following: Amprenavir Fosamprenavir This medication may also interact with the following: Acitretin Aprepitant Armodafinil Bexarotene Bosentan Carbamazepine Certain antivirals for HIV or hepatitis Certain medications for fungal infections, such as fluconazole , ketoconazole, itraconazole, or voriconazole Cyclosporine Felbamate Griseofulvin Lamotrigine Modafinil Oxcarbazepine Phenobarbital Phenytoin Primidone Rifabutin Rifampin Rifapentine St. John's wort Topiramate This list may not describe all possible interactions. Give your health care provider a list of all the medicines, herbs, non-prescription drugs, or dietary supplements you use. Also tell them if you smoke, drink alcohol, or use illegal drugs. Some items may interact with your medicine. What should I watch for while using this medication? Visit your care team for regular checks on your progress. Using this medication does not protect you or your partner against HIV or other sexually transmitted infections (STIs). You should be able to feel the implant by pressing your fingertips over the skin where it was inserted. Contact your care team if you cannot feel the implant, and use a non-hormonal birth control method (such as condoms) until your care team confirms that the implant is in place. Contact your care team if you think that the implant may have broken or become bent while in your arm. You will receive a user card from your care team after the implant is inserted. The card is a record of the location of the implant in your upper arm and when it should be removed. Keep this card with your health records. What side effects may I notice from receiving this medication? Side effects that you should report to your care team as soon as  possible: Allergic reactions--skin rash, itching, hives, swelling of the face, lips, tongue, or throat Blood clot--pain, swelling, or warmth in the leg, shortness of  breath, chest pain Gallbladder problems--severe stomach pain, nausea, vomiting, fever Increase in blood pressure Liver injury--right upper belly pain, loss of appetite, nausea, light-colored stool, dark yellow or brown urine, yellowing skin or eyes, unusual weakness or fatigue New or worsening migraines or headaches Pain, redness, or irritation at injection site Stroke--sudden numbness or weakness of the face, arm, or leg, trouble speaking, confusion, trouble walking, loss of balance or coordination, dizziness, severe headache, change in vision Unusual vaginal discharge, itching, or odor Worsening mood, feelings of depression Side effects that usually do not require medical attention (report to your care team if they continue or are bothersome): Breast pain or tenderness Dark patches of skin on the face or other sun-exposed areas Irregular menstrual cycles or spotting Nausea Weight gain This list may not describe all possible side effects. Call your doctor for medical advice about side effects. You may report side effects to FDA at 1-800-FDA-1088. Where should I keep my medication? This medication is given in a hospital or clinic and will not be stored at home. NOTE: This sheet is a summary. It may not cover all possible information. If you have questions about this medicine, talk to your doctor, pharmacist, or health care provider.  2024 [employer] (2022-05-26 00:00:00)

## 2024-07-26 ENCOUNTER — Ambulatory Visit (HOSPITAL_COMMUNITY): Admission: RE | Admit: 2024-07-26 | Source: Ambulatory Visit

## 2024-09-27 ENCOUNTER — Ambulatory Visit: Admitting: Internal Medicine
